# Patient Record
Sex: Female | Born: 1970
Health system: Southern US, Community
[De-identification: ages and names within clinical notes are randomized; demographics above are authoritative.]

## PROBLEM LIST (undated history)

## (undated) DIAGNOSIS — Q8500 Neurofibromatosis, unspecified: Secondary | ICD-10-CM

## (undated) DIAGNOSIS — Z86001 Personal history of in-situ neoplasm of cervix uteri: Secondary | ICD-10-CM

## (undated) DIAGNOSIS — G43909 Migraine, unspecified, not intractable, without status migrainosus: Secondary | ICD-10-CM

## (undated) HISTORY — DX: Personal history of in-situ neoplasm of cervix uteri: Z86.001

## (undated) HISTORY — DX: Migraine, unspecified, not intractable, without status migrainosus: G43.909

## (undated) HISTORY — DX: Neurofibromatosis, unspecified: Q85.00

---

## 1998-01-23 ENCOUNTER — Encounter: Admission: RE | Admit: 1998-01-23 | Discharge: 1998-01-23 | Payer: Self-pay | Admitting: Family Medicine

## 1998-02-22 ENCOUNTER — Other Ambulatory Visit: Admission: RE | Admit: 1998-02-22 | Discharge: 1998-02-22 | Payer: Self-pay | Admitting: *Deleted

## 1998-02-22 ENCOUNTER — Encounter: Admission: RE | Admit: 1998-02-22 | Discharge: 1998-02-22 | Payer: Self-pay | Admitting: Family Medicine

## 1998-03-01 ENCOUNTER — Encounter: Admission: RE | Admit: 1998-03-01 | Discharge: 1998-03-01 | Payer: Self-pay | Admitting: Family Medicine

## 1998-04-15 ENCOUNTER — Other Ambulatory Visit: Admission: RE | Admit: 1998-04-15 | Discharge: 1998-04-15 | Payer: Self-pay | Admitting: *Deleted

## 1998-04-15 ENCOUNTER — Encounter: Admission: RE | Admit: 1998-04-15 | Discharge: 1998-04-15 | Payer: Self-pay | Admitting: Family Medicine

## 1998-04-29 ENCOUNTER — Encounter: Admission: RE | Admit: 1998-04-29 | Discharge: 1998-04-29 | Payer: Self-pay | Admitting: Family Medicine

## 1998-05-13 ENCOUNTER — Encounter: Admission: RE | Admit: 1998-05-13 | Discharge: 1998-05-13 | Payer: Self-pay | Admitting: Family Medicine

## 1998-06-12 ENCOUNTER — Encounter: Admission: RE | Admit: 1998-06-12 | Discharge: 1998-06-12 | Payer: Self-pay | Admitting: Family Medicine

## 1998-06-19 ENCOUNTER — Encounter: Admission: RE | Admit: 1998-06-19 | Discharge: 1998-06-19 | Payer: Self-pay | Admitting: Sports Medicine

## 1998-06-23 ENCOUNTER — Encounter: Payer: Self-pay | Admitting: Emergency Medicine

## 1998-06-23 ENCOUNTER — Emergency Department (HOSPITAL_COMMUNITY): Admission: EM | Admit: 1998-06-23 | Discharge: 1998-06-23 | Payer: Self-pay | Admitting: Emergency Medicine

## 1998-07-26 ENCOUNTER — Encounter: Admission: RE | Admit: 1998-07-26 | Discharge: 1998-07-26 | Payer: Self-pay | Admitting: Family Medicine

## 1998-08-02 ENCOUNTER — Encounter: Admission: RE | Admit: 1998-08-02 | Discharge: 1998-08-02 | Payer: Self-pay | Admitting: Family Medicine

## 1998-08-06 ENCOUNTER — Encounter: Admission: RE | Admit: 1998-08-06 | Discharge: 1998-08-06 | Payer: Self-pay | Admitting: Sports Medicine

## 1998-08-13 ENCOUNTER — Other Ambulatory Visit: Admission: RE | Admit: 1998-08-13 | Discharge: 1998-08-13 | Payer: Self-pay | Admitting: Family Medicine

## 1998-08-13 ENCOUNTER — Encounter: Admission: RE | Admit: 1998-08-13 | Discharge: 1998-08-13 | Payer: Self-pay | Admitting: Family Medicine

## 1998-10-10 ENCOUNTER — Encounter: Admission: RE | Admit: 1998-10-10 | Discharge: 1998-10-10 | Payer: Self-pay | Admitting: Family Medicine

## 1998-10-14 ENCOUNTER — Encounter: Admission: RE | Admit: 1998-10-14 | Discharge: 1998-10-14 | Payer: Self-pay | Admitting: Sports Medicine

## 1998-11-01 ENCOUNTER — Encounter: Admission: RE | Admit: 1998-11-01 | Discharge: 1998-11-01 | Payer: Self-pay | Admitting: Family Medicine

## 1999-02-24 ENCOUNTER — Encounter: Admission: RE | Admit: 1999-02-24 | Discharge: 1999-02-24 | Payer: Self-pay | Admitting: Family Medicine

## 1999-04-29 ENCOUNTER — Encounter: Admission: RE | Admit: 1999-04-29 | Discharge: 1999-04-29 | Payer: Self-pay | Admitting: Family Medicine

## 1999-04-29 ENCOUNTER — Other Ambulatory Visit: Admission: RE | Admit: 1999-04-29 | Discharge: 1999-04-29 | Payer: Self-pay | Admitting: Family Medicine

## 1999-07-25 ENCOUNTER — Encounter: Admission: RE | Admit: 1999-07-25 | Discharge: 1999-07-25 | Payer: Self-pay | Admitting: Obstetrics & Gynecology

## 1999-07-25 ENCOUNTER — Other Ambulatory Visit: Admission: RE | Admit: 1999-07-25 | Discharge: 1999-07-25 | Payer: Self-pay | Admitting: Obstetrics & Gynecology

## 1999-08-08 ENCOUNTER — Encounter: Admission: RE | Admit: 1999-08-08 | Discharge: 1999-08-08 | Payer: Self-pay | Admitting: Obstetrics & Gynecology

## 1999-10-09 ENCOUNTER — Emergency Department (HOSPITAL_COMMUNITY): Admission: EM | Admit: 1999-10-09 | Discharge: 1999-10-09 | Payer: Self-pay | Admitting: *Deleted

## 2000-02-16 ENCOUNTER — Emergency Department (HOSPITAL_COMMUNITY): Admission: EM | Admit: 2000-02-16 | Discharge: 2000-02-16 | Payer: Self-pay | Admitting: *Deleted

## 2000-02-16 ENCOUNTER — Encounter: Payer: Self-pay | Admitting: Emergency Medicine

## 2000-02-16 ENCOUNTER — Encounter: Payer: Self-pay | Admitting: *Deleted

## 2000-02-18 ENCOUNTER — Emergency Department (HOSPITAL_COMMUNITY): Admission: EM | Admit: 2000-02-18 | Discharge: 2000-02-18 | Payer: Self-pay | Admitting: Emergency Medicine

## 2000-10-04 ENCOUNTER — Encounter: Admission: RE | Admit: 2000-10-04 | Discharge: 2000-10-04 | Payer: Self-pay | Admitting: Family Medicine

## 2000-10-07 ENCOUNTER — Encounter: Admission: RE | Admit: 2000-10-07 | Discharge: 2000-10-07 | Payer: Self-pay | Admitting: Family Medicine

## 2000-10-21 ENCOUNTER — Other Ambulatory Visit: Admission: RE | Admit: 2000-10-21 | Discharge: 2000-10-21 | Payer: Self-pay | Admitting: *Deleted

## 2000-10-21 ENCOUNTER — Encounter: Admission: RE | Admit: 2000-10-21 | Discharge: 2000-10-21 | Payer: Self-pay | Admitting: Family Medicine

## 2001-01-05 ENCOUNTER — Emergency Department (HOSPITAL_COMMUNITY): Admission: EM | Admit: 2001-01-05 | Discharge: 2001-01-05 | Payer: Self-pay | Admitting: Emergency Medicine

## 2001-04-06 ENCOUNTER — Encounter: Admission: RE | Admit: 2001-04-06 | Discharge: 2001-04-06 | Payer: Self-pay | Admitting: Family Medicine

## 2001-10-13 ENCOUNTER — Other Ambulatory Visit: Admission: RE | Admit: 2001-10-13 | Discharge: 2001-10-13 | Payer: Self-pay | Admitting: *Deleted

## 2001-10-13 ENCOUNTER — Encounter: Admission: RE | Admit: 2001-10-13 | Discharge: 2001-10-13 | Payer: Self-pay | Admitting: Family Medicine

## 2002-12-13 ENCOUNTER — Encounter: Admission: RE | Admit: 2002-12-13 | Discharge: 2002-12-13 | Payer: Self-pay | Admitting: Family Medicine

## 2002-12-13 ENCOUNTER — Other Ambulatory Visit: Admission: RE | Admit: 2002-12-13 | Discharge: 2002-12-13 | Payer: Self-pay | Admitting: Family Medicine

## 2003-03-14 ENCOUNTER — Encounter: Admission: RE | Admit: 2003-03-14 | Discharge: 2003-03-14 | Payer: Self-pay | Admitting: Sports Medicine

## 2003-06-15 ENCOUNTER — Encounter: Admission: RE | Admit: 2003-06-15 | Discharge: 2003-06-15 | Payer: Self-pay | Admitting: Sports Medicine

## 2003-09-05 ENCOUNTER — Encounter: Admission: RE | Admit: 2003-09-05 | Discharge: 2003-09-05 | Payer: Self-pay | Admitting: Family Medicine

## 2003-12-05 ENCOUNTER — Encounter: Admission: RE | Admit: 2003-12-05 | Discharge: 2003-12-05 | Payer: Self-pay | Admitting: Family Medicine

## 2004-01-11 ENCOUNTER — Encounter: Admission: RE | Admit: 2004-01-11 | Discharge: 2004-01-11 | Payer: Self-pay | Admitting: Family Medicine

## 2004-03-19 ENCOUNTER — Ambulatory Visit: Payer: Self-pay | Admitting: Family Medicine

## 2004-03-26 ENCOUNTER — Ambulatory Visit: Payer: Self-pay | Admitting: Family Medicine

## 2004-08-12 ENCOUNTER — Ambulatory Visit: Payer: Self-pay | Admitting: Sports Medicine

## 2004-12-27 ENCOUNTER — Encounter (INDEPENDENT_AMBULATORY_CARE_PROVIDER_SITE_OTHER): Payer: Self-pay | Admitting: *Deleted

## 2004-12-27 LAB — CONVERTED CEMR LAB

## 2005-01-06 ENCOUNTER — Ambulatory Visit: Payer: Self-pay | Admitting: Family Medicine

## 2005-01-06 ENCOUNTER — Encounter (INDEPENDENT_AMBULATORY_CARE_PROVIDER_SITE_OTHER): Payer: Self-pay | Admitting: Specialist

## 2005-04-08 ENCOUNTER — Ambulatory Visit: Payer: Self-pay | Admitting: Family Medicine

## 2005-07-01 ENCOUNTER — Ambulatory Visit: Payer: Self-pay | Admitting: Family Medicine

## 2005-09-30 ENCOUNTER — Ambulatory Visit: Payer: Self-pay | Admitting: Family Medicine

## 2006-07-02 ENCOUNTER — Encounter (INDEPENDENT_AMBULATORY_CARE_PROVIDER_SITE_OTHER): Payer: Self-pay | Admitting: Family Medicine

## 2006-07-02 ENCOUNTER — Ambulatory Visit: Payer: Self-pay | Admitting: Family Medicine

## 2006-07-02 LAB — CONVERTED CEMR LAB
Chlamydia, DNA Probe: NEGATIVE
GC Probe Amp, Genital: NEGATIVE

## 2006-07-27 ENCOUNTER — Ambulatory Visit: Payer: Self-pay | Admitting: Family Medicine

## 2006-07-27 ENCOUNTER — Encounter (INDEPENDENT_AMBULATORY_CARE_PROVIDER_SITE_OTHER): Payer: Self-pay | Admitting: Family Medicine

## 2006-08-10 ENCOUNTER — Ambulatory Visit: Payer: Self-pay | Admitting: Family Medicine

## 2006-08-26 DIAGNOSIS — D573 Sickle-cell trait: Secondary | ICD-10-CM | POA: Insufficient documentation

## 2006-08-27 ENCOUNTER — Encounter (INDEPENDENT_AMBULATORY_CARE_PROVIDER_SITE_OTHER): Payer: Self-pay | Admitting: *Deleted

## 2006-11-10 ENCOUNTER — Ambulatory Visit: Payer: Self-pay | Admitting: Family Medicine

## 2007-02-09 ENCOUNTER — Ambulatory Visit: Payer: Self-pay | Admitting: Family Medicine

## 2007-05-25 ENCOUNTER — Ambulatory Visit: Payer: Self-pay | Admitting: Family Medicine

## 2009-11-22 ENCOUNTER — Other Ambulatory Visit: Admission: RE | Admit: 2009-11-22 | Discharge: 2009-11-22 | Payer: Self-pay | Admitting: Family Medicine

## 2009-11-22 ENCOUNTER — Encounter: Payer: Self-pay | Admitting: Family Medicine

## 2009-11-22 ENCOUNTER — Ambulatory Visit: Payer: Self-pay | Admitting: Family Medicine

## 2009-11-22 DIAGNOSIS — G43909 Migraine, unspecified, not intractable, without status migrainosus: Secondary | ICD-10-CM

## 2009-11-22 DIAGNOSIS — R3589 Other polyuria: Secondary | ICD-10-CM | POA: Insufficient documentation

## 2009-11-22 DIAGNOSIS — M545 Low back pain, unspecified: Secondary | ICD-10-CM | POA: Insufficient documentation

## 2009-11-22 DIAGNOSIS — Q8500 Neurofibromatosis, unspecified: Secondary | ICD-10-CM | POA: Insufficient documentation

## 2009-11-22 DIAGNOSIS — R358 Other polyuria: Secondary | ICD-10-CM

## 2009-11-22 DIAGNOSIS — D069 Carcinoma in situ of cervix, unspecified: Secondary | ICD-10-CM | POA: Insufficient documentation

## 2009-11-22 HISTORY — DX: Neurofibromatosis, unspecified: Q85.00

## 2009-11-22 HISTORY — DX: Migraine, unspecified, not intractable, without status migrainosus: G43.909

## 2009-11-22 LAB — CONVERTED CEMR LAB
ALT: 8 units/L (ref 0–35)
CO2: 22 meq/L (ref 19–32)
Chlamydia, DNA Probe: NEGATIVE
Cholesterol: 191 mg/dL (ref 0–200)
GC Probe Amp, Genital: NEGATIVE
HCV Ab: NEGATIVE
Hepatitis B Surface Ag: NEGATIVE
LDL Cholesterol: 115 mg/dL — ABNORMAL HIGH (ref 0–99)
MCV: 83.3 fL (ref 78.0–100.0)
Nitrite: NEGATIVE
Platelets: 181 10*3/uL (ref 150–400)
Sodium: 140 meq/L (ref 135–145)
Total Bilirubin: 0.9 mg/dL (ref 0.3–1.2)
Total Protein: 7.2 g/dL (ref 6.0–8.3)
Urobilinogen, UA: 0.2
VLDL: 7 mg/dL (ref 0–40)
WBC Urine, dipstick: NEGATIVE
WBC: 4.5 10*3/uL (ref 4.0–10.5)
Whiff Test: NEGATIVE

## 2009-11-29 ENCOUNTER — Encounter: Payer: Self-pay | Admitting: Family Medicine

## 2009-11-29 LAB — CONVERTED CEMR LAB: Pap Smear: NORMAL

## 2010-07-20 ENCOUNTER — Encounter: Payer: Self-pay | Admitting: Sports Medicine

## 2010-07-31 NOTE — Letter (Signed)
Summary: Lipid Letter  [facility]  [address]   [place], [place] [address]   Phone: [phone]  Fax: [phone]    11/29/2009  [name] [address] [place], [place]  [address]  Dear Julia Dalton:  We have carefully reviewed your last lipid profile from 11/22/2009 and the results are noted below with a summary of recommendations for lipid management.    Cholesterol:       191     Goal: < 200   HDL "good" Cholesterol:   69     Goal: > 40   LDL "bad" Cholesterol:   115     Goal: < 160   Triglycerides:       37     Goal: < 150    Your numbers are excellent!  We also checked you kidney function, liver function and blood counts all of which were excellent.  Your STD tests were all negative.  Your pap test was normal.  Your next pap is due in 1 year.   If you have any questions, please call. We appreciate being able to work with you.   Sincerely,    [facility] Delbert Harness MD  Appended Document: Lipid Letter mailed letter to pt

## 2010-07-31 NOTE — Assessment & Plan Note (Signed)
Summary: cpe/pap,tcb   Vital Signs:  Patient profile:   40 year old female LMP:     [id number] Height:      65 inches Weight:      144.9 pounds BMI:     24.20 Temp:     97.9 degrees F oral Pulse rate:   83 / minute Pulse rhythm:   regular BP sitting:   99 / 75  (right arm) Cuff size:   regular  Vitals Entered By: Loralee Pacas CMA (Nov 22, 2009 9:25 AM) CC: cpe/pap Is Patient Diabetic? No Comments pt has been having lower back pain x 1 week LMP (date): 95621308     Enter LMP: [id number] Last PAP Result Done.   CC:  cpe/pap.  History of Present Illness: overall doing well and here for pap/cpe.  she has noticed in recent week some worsening migraine headaches, some lower back pain flares, urinary frequency and polydipsia.  she denies fevers, chills, eye pain or vision changes, runny nose, sore throat, chrest pain, sob, abd pain, urinay incontinence, dysuria, diarrhea, constipation, joint pain other than above, skin rashes that are new other than her chronic neurofibromatosis changes  Habits & Providers  Alcohol-Tobacco-Diet     Alcohol drinks/day: 0     Tobacco Status: never  Exercise-Depression-Behavior     Drug Use: never  Current Medications (verified): 1)  None  Allergies (verified): No Known Drug Allergies  Past History:  Past Medical History: h/o abnl pap, now WNL, (h/o CIN2)  neurofibromatosis sickle cell trait [id number] - all NSVD, all boys,  several therapeutic abortions  Past Surgical History: colpo - CIN I to III, s/p LEEP - 03/30/1999, colposcopy  - mild dysplasia,  abortions  Family History: Aunt with DM,  Father died in 2022/08/27 of liver CA, h/o heavy ETOH and tobacco use Mother - recently passed unexpectedly `chest problem` children, mother-neurofibromatosis cancer in dad's side of family (unknown type) brother - diabetes  Social History: Lives with 2 sons, Jeri Modena (b. 74), Shaqan (b. 1995).  No tobacco, ETOH, drugs.  Works as Immunologist.   1 sexual partner typically uses protection, unsure if he is monogamous.Smoking Status:  never Drug Use:  never  Review of Systems       see hpi  Physical Exam  General:  Well-developed,well-nourished,in no acute distress; alert,appropriate and cooperative throughout examination VSnoted and WNL Head:  Normocephalic and atraumatic without obvious abnormalities. Eyes:  No corneal or conjunctival inflammation noted. EOMI. Perrla. Vision grossly normal.  mild proptosis and ? of exopthalmos Ears:  External ear exam shows no significant lesions or deformities.  Otoscopic examination reveals clear canals, tympanic membranes are intact bilaterally without bulging, retraction, inflammation or discharge. Hearing is grossly normal bilaterally. Nose:  External nasal examination shows no deformity or inflammation. Nasal mucosa are pink and moist without lesions or exudates. Mouth:  Oral mucosa and oropharynx without lesions or exudates. fair dentition Neck:  No deformities, masses, or tenderness noted. Breasts:  No mass, nodules, thickening, tenderness, bulging, retraction, inflamation, nipple discharge noted.  has some neurofibromas on breasts as well as cafe-au-lait spots Lungs:  Normal respiratory effort, chest expands symmetrically. Lungs are clear to auscultation, no crackles or wheezes. Heart:  Normal rate and regular rhythm. S1 and S2 normal without gallop, murmur, click, rub or other extra sounds. Abdomen:  Bowel sounds positive,abdomen soft and non-tender without masses, organomegaly or hernias noted. Genitalia:  Normal introitus for age, no external lesions, no vaginal discharge, mucosa pink and moist, no vaginal or  cervical lesions, no vaginal atrophy, no friaility or hemorrhage, normal uterus size and position, no adnexal masses or tenderness.   cervical os stenotic Msk:  No deformity or scoliosis noted of thoracic or lumbar spine.   Pulses:  2+  in all extremities Extremities:  no cyanosis  clubbing or edema Neurologic:  alert & oriented X3, cranial nerves II-XII intact, and gait normal.   Skin:  multiple neurofibromas diffusley across body as well as cafe-au-lait spots.  no obviously suspecious lesions noted Axillary Nodes:  No palpable lymphadenopathy Psych:  Oriented X3, normally interactive, and good eye contact.     Impression & Recommendations:  Problem # 1:  ROUTINE GYNECOLOGICAL EXAMINATION (ICD-V72.31) Assessment Unchanged  overall doing well.  given handout to schedule a mammogram. pap performed today. get some basic blood work today for screening particularly given some of her review of symptoms (polyuria - check CMET, low back pain - check CBC, proptosis - check TSH, age - check lipids) get Tdap today since due.   given handout on birth control options.  call if anything abnormal on labs/studies? [phone]  Orders: [facility] - Est  40-64 yrs [phone])  Other Orders: Pap Smear-[facility] ([id number]) Urinalysis-[facility] (00000) Urine Culture-[facility] ([id number]) GC/Chlamydia-[facility] ([id number]) Hep C Ab-[facility] ([id number]) Hep Bs Ag-[facility] ([id number]) RPR-[facility] [phone]) Comp Met-[facility] [phone]) CBC-[facility] (51884) TSH-[facility] [phone]) Lipid-[facility] [phone]) [facility] ([address]25) Tdap => 56yrs IM (42706) Admin 1st Vaccine (23762)  Patient Instructions: 1)  Please schedule a mammogram 2)  I will call you if there is anything abnormal on your blood work. 3)  Look through your birth control options and let [place] know if there are some you have questions about.   4)  It was nice to meet you.  Laboratory Results   Urine Tests  Date/Time Received: Nov 22, 2009 9:57 AM  Date/Time Reported: Nov 22, 2009 10:30 AM   Routine Urinalysis   Color: yellow Appearance: Clear Glucose: negative   (Normal Range: Negative) Bilirubin: negative   (Normal Range: Negative) Ketone: negative   (Normal Range: Negative) Spec. Gravity: 1.010   (Normal Range: 1.003-1.035) Blood: small   (Normal  Range: Negative) pH: 5.5   (Normal Range: 5.0-8.0) Protein: negative   (Normal Range: Negative) Urobilinogen: 0.2   (Normal Range: 0-1) Nitrite: negative   (Normal Range: Negative) Leukocyte Esterace: negative   (Normal Range: Negative)  Urine Microscopic WBC/HPF: 0-3 RBC/HPF: 0-2 Bacteria/HPF: 1-5 Epithelial/HPF: trace    Comments: urine sent for culture ...........test performed by...........Marland KitchenTerese Door, CMA   Date/Time Received: Nov 22, 2009 10:49 AM  Date/Time Reported: Nov 22, 2009 10:52 AM   [employer] Source: vaginal WBC/hpf: 1-5 Bacteria/hpf: 3+  Rods Clue cells/hpf: none  Negative whiff Yeast/hpf: none Trichomonas/hpf: none Comments: 1-5 RBC's present...........test performed by...........Marland KitchenTerese Door, CMA      Prevention & Chronic Care Immunizations   Influenza vaccine: Not documented    Tetanus booster: 11/22/2009: Tdap    Pneumococcal vaccine: Not documented  Other Screening   Pap smear: Done.  (12/27/2004)    Mammogram: Not documented   Smoking status: never  (11/22/2009)  Lipids   Total Cholesterol: Not documented   LDL: Not documented   LDL Direct: Not documented   HDL: Not documented   Triglycerides: Not documented    Immunizations Administered:  Tetanus Vaccine:    Vaccine Type: Tdap    Site: right deltoid    Mfr: Sanofi Pasteur    Dose: 0.5 ml    Route: IM    Given by: Archie Patten  Barkley CMA    Exp. Date: 09/21/2011    Lot #: [id number]    VIS given: 05/17/07 version given Nov 22, 2009.

## 2014-01-10 ENCOUNTER — Emergency Department (HOSPITAL_COMMUNITY)
Admission: EM | Admit: 2014-01-10 | Discharge: 2014-01-10 | Disposition: A | Discharge status: Home / Self Care | Payer: Self-pay | Source: Home / Self Care | Attending: Family Medicine | Admitting: Family Medicine

## 2014-01-10 ENCOUNTER — Encounter (HOSPITAL_COMMUNITY): Payer: Self-pay | Admitting: Emergency Medicine

## 2014-01-10 DIAGNOSIS — N390 Urinary tract infection, site not specified: Secondary | ICD-10-CM

## 2014-01-10 LAB — POCT URINALYSIS DIP (DEVICE)
Bilirubin Urine: NEGATIVE
Bilirubin Urine: NEGATIVE
GLUCOSE, UA: NEGATIVE mg/dL
Glucose, UA: NEGATIVE mg/dL
KETONES UR: NEGATIVE mg/dL
Ketones, ur: NEGATIVE mg/dL
NITRITE: NEGATIVE
Nitrite: NEGATIVE
PH: 6 (ref 5.0–8.0)
Protein, ur: NEGATIVE mg/dL
Protein, ur: NEGATIVE mg/dL
SPECIFIC GRAVITY, URINE: 1.01 (ref 1.005–1.030)
Specific Gravity, Urine: 1.015 (ref 1.005–1.030)
Urobilinogen, UA: 0.2 mg/dL (ref 0.0–1.0)
Urobilinogen, UA: 0.2 mg/dL (ref 0.0–1.0)
pH: 5.5 (ref 5.0–8.0)

## 2014-01-10 MED ORDER — NITROFURANTOIN MONOHYD MACRO 100 MG PO CAPS: 100.0000 mg | ORAL_CAPSULE | Freq: Two times a day (BID) | ORAL | Status: DC

## 2014-01-10 NOTE — Discharge Instructions (Signed)

## 2014-01-10 NOTE — ED Notes (Signed)
Feels like another UTI; denies STD concerns at present

## 2014-01-10 NOTE — ED Provider Notes (Signed)
CSN: [id number]     Arrival date & time 01/10/14  0818 History   First MD Initiated Contact with Patient 01/10/14 [phone]     Chief Complaint  Patient presents with  . Urinary Tract Infection   (Consider location/radiation/quality/duration/timing/severity/associated sxs/prior Treatment) HPI Comments: 2 days of malodorous urine, urinary frequency, and mild suprapubic abdominal tenderness.  LNMP: 1 weeks ago PCP: [facility] No vaginal discharge No fever, N/V or flank pain  Patient is a 43 y.o. female presenting with urinary tract infection. The history is provided by the patient.  Urinary Tract Infection This is a new problem. The current episode started 2 days ago. The problem occurs constantly. The problem has been gradually worsening.    History reviewed. No pertinent past medical history. History reviewed. No pertinent past surgical history. History reviewed. No pertinent family history. History  Substance Use Topics  . Smoking status: Never Smoker   . Smokeless tobacco: Not on file  . Alcohol Use: Yes   OB History   Grav Para Term Preterm Abortions TAB SAB Ect Mult Living                 Review of Systems  All other systems reviewed and are negative.   Allergies  Review of patient's allergies indicates no known allergies.  Home Medications   Prior to Admission medications   Medication Sig Start Date End Date Taking? Authorizing Provider  nitrofurantoin, macrocrystal-monohydrate, (MACROBID) 100 MG capsule Take 1 capsule (100 mg total) by mouth 2 (two) times daily. X 7 days 01/10/14   Jess BartersJennifer Lee Sierrah Luevano, PA   BP 124/85  Pulse 101  Temp(Src) 98.3 F (36.8 C) (Oral)  Resp 18  SpO2 99% Physical Exam  Nursing note and vitals reviewed. Constitutional: She is oriented to person, place, and time. She appears well-developed and well-nourished. No distress.  HENT:  Head: Normocephalic and atraumatic.  Eyes: Conjunctivae are normal. No scleral icterus.  Cardiovascular: Normal  rate, regular rhythm and normal heart sounds.   Pulmonary/Chest: Effort normal and breath sounds normal. No respiratory distress. She has no wheezes.  Abdominal: Soft. Bowel sounds are normal. She exhibits no distension. There is no tenderness. There is no CVA tenderness.  Musculoskeletal: Normal range of motion.  Neurological: She is alert and oriented to person, place, and time.  Skin: Skin is warm and dry. No rash noted. No erythema.  Psychiatric: She has a normal mood and affect. Her behavior is normal.    ED Course  Procedures (including critical care time) Labs Review Labs Reviewed  POCT URINALYSIS DIP (DEVICE) - Abnormal; Notable for the following:    Hgb urine dipstick MODERATE (*)    Leukocytes, UA MODERATE (*)    All other components within normal limits  URINE CULTURE    Imaging Review No results found.   MDM   1. UTI (lower urinary tract infection)    Urine sent for C&S. UA suggestive of UTI. Will treat with 7 day course of Macrobid and advise PCP follow up if no improvement.   Jess BartersJennifer Lee [place] EneasPresson, [place] 01/10/14 [phone]

## 2014-01-11 NOTE — ED Provider Notes (Signed)
Medical screening examination/treatment/procedure(s) were performed by a resident physician or non-physician practitioner and as the supervising physician I was immediately available for consultation/collaboration.  Evan Corey, MD    Evan S Corey, MD 01/11/14 0803 

## 2014-01-12 LAB — URINE CULTURE: Colony Count: >=100000

## 2014-01-13 ENCOUNTER — Telehealth (HOSPITAL_COMMUNITY): Payer: Self-pay | Admitting: *Deleted

## 2014-01-13 NOTE — ED Notes (Signed)
Urine culture: Enterobacter aerogenes.  Treated with Macrobid- intermediate on sensitivity.  Lab shown to Dr. Denyse Amassorey.  He said Macrobid concentrates in the urine. Call for clinical improvement.  If not better will change to Cipro. I called pt. Pt. verified x 2 and given result.  Pt. said she is doing real good.  No further symptoms. Pt. instructed to finish all of the medication and get rechecked if any further symptoms.  Dr. Denyse Amassorey notified pt. is better. Vassie MoselleYork, Meko Masterson M 01/13/2014

## 2014-10-17 ENCOUNTER — Ambulatory Visit (INDEPENDENT_AMBULATORY_CARE_PROVIDER_SITE_OTHER): Payer: 59 | Admitting: Obstetrics and Gynecology

## 2014-10-17 ENCOUNTER — Encounter: Payer: Self-pay | Admitting: Obstetrics and Gynecology

## 2014-10-17 VITALS — BP 121/77 | HR 79 | Temp 98.0°F | Ht 65.0 in | Wt 155.0 lb

## 2014-10-17 DIAGNOSIS — Z7189 Other specified counseling: Secondary | ICD-10-CM | POA: Diagnosis not present

## 2014-10-17 DIAGNOSIS — Z0000 Encounter for general adult medical examination without abnormal findings: Secondary | ICD-10-CM | POA: Diagnosis not present

## 2014-10-17 DIAGNOSIS — Z7689 Persons encountering health services in other specified circumstances: Secondary | ICD-10-CM

## 2014-10-17 DIAGNOSIS — H539 Unspecified visual disturbance: Secondary | ICD-10-CM

## 2014-10-17 NOTE — Progress Notes (Signed)
     Subjective: Chief Complaint  Patient presents with  . Establish Care  . Headache  . Eye Problem    HPI: Julia Dalton is a 44 y.o. presenting to clinic today to establish care. She was previously seen in this clinic about 4 years ago. She is unsure why she stopped coming to the doctor. However she wants to reestablish care at this time. PMH is significant for neurofibromatosis, carcinoma in situ of cervix, and sickle cell trait. She will like to discuss the following:  #Headaches: -She has history of migraine headaches -States that she has had a headache everyday for the last couple weeks -Headaches occur in the mornings when she wakes up -takes 2 Aleve which help resolve headache -Severity scale 8 out of 10 -Denies any nausea vomiting photophobia or phonophobia -Does have some vision changes  #Vision Changes: -Patient has eye appointment scheduled for next week - states that she needs referral for insurance purposes -Complaining of recent vision changes some blurred vision -Please she needs new glasses  Health Maintenance: Supposed to have yearly Pap smears but has not had a Pap smear in over 5 years. Patient also due for lab work. Will also start mammography testing and 44 year old.   ROS reviewed and were negative unless otherwise noted in HPI. Pertinently, no chest pain, palpitations, SOB, Fever, Chills, Abd pain, N/V/D.  Past Medical, Surgical, Social, and Family History Reviewed & Updated per EMR.  Objective: BP 121/77 mmHg  Pulse 79  Temp(Src) 98 F (36.7 C) (Oral)  Ht 5\' 5"  (1.651 m)  Wt 155 lb (70.308 kg)  BMI 25.79 kg/m2  LMP 09/16/2014 (Approximate)  Physical Exam  Constitutional: She is oriented to person, place, and time and well-developed, well-nourished, and in no distress.  HENT:  Head: Normocephalic and atraumatic.  Mouth/Throat: Oropharynx is clear and moist.  Poor dentation  Eyes: Conjunctivae and EOM are normal. Pupils are equal, round, and  reactive to light.  Neck: Normal range of motion. Neck supple. No thyromegaly present.  Cardiovascular: Normal rate, regular rhythm, normal heart sounds and intact distal pulses.   Pulmonary/Chest: Effort normal and breath sounds normal.  Abdominal: Soft. Bowel sounds are normal. There is no tenderness.  Large neurofibroma located on R. side of abdomen measuring about 1"x1"x1"  Musculoskeletal: Normal range of motion. She exhibits no edema.  Lymphadenopathy:    She has no cervical adenopathy.  Neurological: She is alert and oriented to person, place, and time. No cranial nerve deficit. Gait normal.  Skin: Skin is warm and dry.  multiple neurofibromas.    Assessment/Plan: Please see problem based Assessment and Plan   Orders Placed This Encounter  Procedures  . Ambulatory referral to Ophthalmology    Referral Priority:  Routine    Referral Type:  Consultation    Referral Reason:  Specialty Services Required    Requested Specialty:  Ophthalmology    Number of Visits Requested:  1    Caryl AdaJazma Phelps, DO 10/17/2014, 3:04 PM PGY-1, [facility]

## 2014-10-17 NOTE — Assessment & Plan Note (Signed)
A: History of migraine headaches. Unsure if current headaches due to migraines. Vitals signs are stable. No red flag symptoms.  P: Patient to continue using Aleve for headache relief. Will monitor and follow-up at next visit.

## 2014-10-17 NOTE — Assessment & Plan Note (Signed)
Will get Pap smear and blood work at next visit in 1 week.

## 2014-10-17 NOTE — Patient Instructions (Signed)
Ms. Cliffton AstersWhite it was great to meet you today!  I am pleased to hear that things are going well for you.  Here are some of the things we discussed today: -I want you to come into clinic for a physical exam. I need to do a pap and get blood work on you. -At that next visit we can also discuss some of your other issues -Continue to use aleve at this time for headache. I believe it is do to your vision changes and need for updated exam. Please keep eye doctor exam -Referral has been placed  Please schedule a follow-up appointment for one week.   Thanks for allowing me to be a part of your care! Dr. Doroteo GlassmanPhelps

## 2014-10-25 ENCOUNTER — Encounter: Payer: Self-pay | Admitting: Obstetrics and Gynecology

## 2014-10-25 ENCOUNTER — Ambulatory Visit (INDEPENDENT_AMBULATORY_CARE_PROVIDER_SITE_OTHER): Payer: 59 | Admitting: Obstetrics and Gynecology

## 2014-10-25 ENCOUNTER — Other Ambulatory Visit (HOSPITAL_COMMUNITY)
Admission: RE | Admit: 2014-10-25 | Discharge: 2014-10-25 | Disposition: A | Discharge status: Home / Self Care | Payer: 59 | Source: Ambulatory Visit | Attending: Family Medicine | Admitting: Family Medicine

## 2014-10-25 VITALS — BP 127/85 | HR 84 | Temp 98.3°F | Ht 65.0 in | Wt 156.0 lb

## 2014-10-25 DIAGNOSIS — Z0189 Encounter for other specified special examinations: Secondary | ICD-10-CM

## 2014-10-25 DIAGNOSIS — Z30011 Encounter for initial prescription of contraceptive pills: Secondary | ICD-10-CM

## 2014-10-25 DIAGNOSIS — Z1151 Encounter for screening for human papillomavirus (HPV): Secondary | ICD-10-CM | POA: Insufficient documentation

## 2014-10-25 DIAGNOSIS — Z202 Contact with and (suspected) exposure to infections with a predominantly sexual mode of transmission: Secondary | ICD-10-CM

## 2014-10-25 DIAGNOSIS — Q8500 Neurofibromatosis, unspecified: Secondary | ICD-10-CM

## 2014-10-25 DIAGNOSIS — Z01419 Encounter for gynecological examination (general) (routine) without abnormal findings: Secondary | ICD-10-CM

## 2014-10-25 DIAGNOSIS — Z124 Encounter for screening for malignant neoplasm of cervix: Secondary | ICD-10-CM

## 2014-10-25 DIAGNOSIS — Z0000 Encounter for general adult medical examination without abnormal findings: Secondary | ICD-10-CM

## 2014-10-25 DIAGNOSIS — IMO0001 Reserved for inherently not codable concepts without codable children: Secondary | ICD-10-CM | POA: Insufficient documentation

## 2014-10-25 DIAGNOSIS — Z113 Encounter for screening for infections with a predominantly sexual mode of transmission: Secondary | ICD-10-CM | POA: Insufficient documentation

## 2014-10-25 LAB — COMPREHENSIVE METABOLIC PANEL
ALBUMIN: 3.8 g/dL (ref 3.5–5.2)
ALK PHOS: 46 U/L (ref 39–117)
ALT: 11 U/L (ref 0–35)
AST: 15 U/L (ref 0–37)
BILIRUBIN TOTAL: 0.3 mg/dL (ref 0.2–1.2)
BUN: 10 mg/dL (ref 6–23)
CO2: 25 mEq/L (ref 19–32)
Calcium: 8.7 mg/dL (ref 8.4–10.5)
Chloride: 105 mEq/L (ref 96–112)
Creat: 0.68 mg/dL (ref 0.50–1.10)
Glucose, Bld: 84 mg/dL (ref 70–99)
Potassium: 4 mEq/L (ref 3.5–5.3)
SODIUM: 138 meq/L (ref 135–145)
Total Protein: 6.5 g/dL (ref 6.0–8.3)

## 2014-10-25 LAB — LIPID PANEL
CHOL/HDL RATIO: 2.7 ratio
Cholesterol: 158 mg/dL (ref 0–200)
HDL: 59 mg/dL (ref >=46)
LDL CALC: 88 mg/dL (ref 0–99)
TRIGLYCERIDES: 54 mg/dL (ref <150)
VLDL: 11 mg/dL (ref 0–40)

## 2014-10-25 LAB — CBC
HEMATOCRIT: 38.2 % (ref 36.0–46.0)
Hemoglobin: 12.7 g/dL (ref 12.0–15.0)
MCH: 28.1 pg (ref 26.0–34.0)
MCHC: 33.2 g/dL (ref 30.0–36.0)
MCV: 84.5 fL (ref 78.0–100.0)
MPV: 10.9 fL (ref 8.6–12.4)
PLATELETS: 182 10*3/uL (ref 150–400)
RBC: 4.52 MIL/uL (ref 3.87–5.11)
RDW: 14.6 % (ref 11.5–15.5)
WBC: 6.2 10*3/uL (ref 4.0–10.5)

## 2014-10-25 MED ORDER — NORGESTIMATE-ETH ESTRADIOL 0.25-35 MG-MCG PO TABS: 1.0000 | ORAL_TABLET | Freq: Every day | ORAL | Status: DC

## 2014-10-25 NOTE — Assessment & Plan Note (Addendum)
Unsure of which type and patient in unaware. There is a strong family history. 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 Without complication currently.

## 2014-10-25 NOTE — Progress Notes (Signed)
  Subjective:     Julia Dalton is a 44 y.o. female and is here for a comprehensive physical exam. The patient reports no problems.  Patient wants full STD screening. Also would like to know her options for birth control.   History   Social History  . Marital Status: Single    Spouse Name: N/A  . Number of Children: 2  . Years of Education: 14   Occupational History  . Not on file.   Social History Main Topics  . Smoking status: Never Smoker   . Smokeless tobacco: Not on file  . Alcohol Use: Yes     Comment: occasional  . Drug Use: No  . Sexual Activity: Yes    Birth Control/ Protection: Condom   Other Topics Concern  . Not on file   Social History Narrative   Health Maintenance  Topic Date Due  . HIV Screening  02/03/1984  . INFLUENZA VACCINE  01/28/2015  . PAP SMEAR  10/24/2017  . TETANUS/TDAP  11/23/2019    The following portions of the patient's history were reviewed and updated as appropriate: allergies, current medications, past family history, past medical history, past social history, past surgical history and problem list.  Review of Systems A comprehensive review of systems was negative.   Objective:    BP 127/85 mmHg  Pulse 84  Temp(Src) 98.3 F (36.8 C) (Oral)  Ht 5\' 5"  (1.651 m)  Wt 156 lb (70.761 kg)  BMI 25.96 kg/m2  LMP 10/16/2014 General appearance: alert, cooperative and no distress Head: Normocephalic, without obvious abnormality, atraumatic Eyes: conjunctivae/corneas clear. PERRL, EOM's intact. Fundi benign. Throat: lips, mucosa, and tongue normal; teeth and gums normal Neck: no adenopathy, supple, symmetrical, trachea midline and thyroid not enlarged, symmetric, no tenderness/mass/nodules Lungs: clear to auscultation bilaterally Breasts: normal appearance, no masses or tenderness, Inspection negative, No nipple retraction or dimpling, No nipple discharge or bleeding, No axillary or supraclavicular adenopathy Heart: regular rate and  rhythm, S1, S2 normal, no murmur, click, rub or gallop Abdomen: soft, non-tender; bowel sounds normal; no masses,  no organomegaly Pelvic: cervix normal in appearance, external genitalia normal, no adnexal masses or tenderness, no cervical motion tenderness, uterus normal size, shape, and consistency and vagina normal without discharge Extremities: extremities normal, atraumatic, no cyanosis or edema Pulses: 2+ and symmetric Skin: normal or cafe-au-lait spot(s) - scattered, excoriation - hand(s) right and multiple scattered neurofibromas diffusely Neurologic: Grossly normal    Assessment:    Healthy female exam.      Plan:   Please see Problem based Assessment and Plan   See After Visit Summary for Counseling Recommendations   Orders Placed This Encounter  Procedures  . Lipid panel  . CBC  . Comprehensive metabolic panel  . HIV antibody  . RPR   Meds ordered this encounter  Medications  . norgestimate-ethinyl estradiol (SPRINTEC 28) 0.25-35 MG-MCG tablet    Sig: Take 1 tablet by mouth daily.    Dispense:  1 Package    Refill:  [id number]   Kahmya Pinkham, DO 10/25/2014, 3:44 PM PGY-1, [facility]

## 2014-10-25 NOTE — Assessment & Plan Note (Signed)
Pap smear and routine blood work obtained for annual wellness visit.

## 2014-10-25 NOTE — Assessment & Plan Note (Signed)
Discussed contraception options with patient. Rx given for OCPs.

## 2014-10-25 NOTE — Patient Instructions (Signed)
Preventive Care for Adults A healthy lifestyle and preventive care can promote health and wellness. Preventive health guidelines for women include the following key practices.  A routine yearly physical is a good way to check with your health care provider about your health and preventive screening. It is a chance to share any concerns and updates on your health and to receive a thorough exam.  Visit your dentist for a routine exam and preventive care every 6 months. Brush your teeth twice a day and floss once a day. Good oral hygiene prevents tooth decay and gum disease.  The frequency of eye exams is based on your age, health, family medical history, use of contact lenses, and other factors. Follow your health care provider's recommendations for frequency of eye exams.  Eat a healthy diet. Foods like vegetables, fruits, whole grains, low-fat dairy products, and lean protein foods contain the nutrients you need without too many calories. Decrease your intake of foods high in solid fats, added sugars, and salt. Eat the right amount of calories for you.Get information about a proper diet from your health care provider, if necessary.  Regular physical exercise is one of the most important things you can do for your health. Most adults should get at least 150 minutes of moderate-intensity exercise (any activity that increases your heart rate and causes you to sweat) each week. In addition, most adults need muscle-strengthening exercises on 2 or more days a week.  Maintain a healthy weight. The body mass index (BMI) is a screening tool to identify possible weight problems. It provides an estimate of body fat based on height and weight. Your health care provider can find your BMI and can help you achieve or maintain a healthy weight.For adults 20 years and older:  A BMI below 18.5 is considered underweight.  A BMI of 18.5 to 24.9 is normal.  A BMI of 25 to 29.9 is considered overweight.  A BMI of  30 and above is considered obese.  Maintain normal blood lipids and cholesterol levels by exercising and minimizing your intake of saturated fat. Eat a balanced diet with plenty of fruit and vegetables. Blood tests for lipids and cholesterol should begin at age 20 and be repeated every 5 years. If your lipid or cholesterol levels are high, you are over 50, or you are at high risk for heart disease, you may need your cholesterol levels checked more frequently.Ongoing high lipid and cholesterol levels should be treated with medicines if diet and exercise are not working.  If you smoke, find out from your health care provider how to quit. If you do not use tobacco, do not start.  Lung cancer screening is recommended for adults aged 55-80 years who are at high risk for developing lung cancer because of a history of smoking. A yearly low-dose CT scan of the lungs is recommended for people who have at least a 30-pack-year history of smoking and are a current smoker or have quit within the past 15 years. A pack year of smoking is smoking an average of 1 pack of cigarettes a day for 1 year (for example: 1 pack a day for 30 years or 2 packs a day for 15 years). Yearly screening should continue until the smoker has stopped smoking for at least 15 years. Yearly screening should be stopped for people who develop a health problem that would prevent them from having lung cancer treatment.  If you are pregnant, do not drink alcohol. If you are breastfeeding,   be very cautious about drinking alcohol. If you are not pregnant and choose to drink alcohol, do not have more than 1 drink per day. One drink is considered to be 12 ounces (355 mL) of beer, 5 ounces (148 mL) of wine, or 1.5 ounces (44 mL) of liquor.  Avoid use of street drugs. Do not share needles with anyone. Ask for help if you need support or instructions about stopping the use of drugs.  High blood pressure causes heart disease and increases the risk of  stroke. Your blood pressure should be checked at least every 1 to 2 years. Ongoing high blood pressure should be treated with medicines if weight loss and exercise do not work.  If you are 3-86 years old, ask your health care provider if you should take aspirin to prevent strokes.  Diabetes screening involves taking a blood sample to check your fasting blood sugar level. This should be done once every 3 years, after age 67, if you are within normal weight and without risk factors for diabetes. Testing should be considered at a younger age or be carried out more frequently if you are overweight and have at least 1 risk factor for diabetes.  Breast cancer screening is essential preventive care for women. You should practice "breast self-awareness." This means understanding the normal appearance and feel of your breasts and may include breast self-examination. Any changes detected, no matter how small, should be reported to a health care provider. Women in their 8s and 30s should have a clinical breast exam (CBE) by a health care provider as part of a regular health exam every 1 to 3 years. After age 70, women should have a CBE every year. Starting at age 25, women should consider having a mammogram (breast X-ray test) every year. Women who have a family history of breast cancer should talk to their health care provider about genetic screening. Women at a high risk of breast cancer should talk to their health care providers about having an MRI and a mammogram every year.  Breast cancer gene (BRCA)-related cancer risk assessment is recommended for women who have family members with BRCA-related cancers. BRCA-related cancers include breast, ovarian, tubal, and peritoneal cancers. Having family members with these cancers may be associated with an increased risk for harmful changes (mutations) in the breast cancer genes BRCA1 and BRCA2. Results of the assessment will determine the need for genetic counseling and  BRCA1 and BRCA2 testing.  Routine pelvic exams to screen for cancer are no longer recommended for nonpregnant women who are considered low risk for cancer of the pelvic organs (ovaries, uterus, and vagina) and who do not have symptoms. Ask your health care provider if a screening pelvic exam is right for you.  If you have had past treatment for cervical cancer or a condition that could lead to cancer, you need Pap tests and screening for cancer for at least 20 years after your treatment. If Pap tests have been discontinued, your risk factors (such as having a new sexual partner) need to be reassessed to determine if screening should be resumed. Some women have medical problems that increase the chance of getting cervical cancer. In these cases, your health care provider may recommend more frequent screening and Pap tests.  The HPV test is an additional test that may be used for cervical cancer screening. The HPV test looks for the virus that can cause the cell changes on the cervix. The cells collected during the Pap test can be  tested for HPV. The HPV test could be used to screen women aged 30 years and older, and should be used in women of any age who have unclear Pap test results. After the age of 30, women should have HPV testing at the same frequency as a Pap test.  Colorectal cancer can be detected and often prevented. Most routine colorectal cancer screening begins at the age of 50 years and continues through age 75 years. However, your health care provider may recommend screening at an earlier age if you have risk factors for colon cancer. On a yearly basis, your health care provider may provide home test kits to check for hidden blood in the stool. Use of a small camera at the end of a tube, to directly examine the colon (sigmoidoscopy or colonoscopy), can detect the earliest forms of colorectal cancer. Talk to your health care provider about this at age 50, when routine screening begins. Direct  exam of the colon should be repeated every 5-10 years through age 75 years, unless early forms of pre-cancerous polyps or small growths are found.  People who are at an increased risk for hepatitis B should be screened for this virus. You are considered at high risk for hepatitis B if:  You were born in a country where hepatitis B occurs often. Talk with your health care provider about which countries are considered high risk.  Your parents were born in a high-risk country and you have not received a shot to protect against hepatitis B (hepatitis B vaccine).  You have HIV or AIDS.  You use needles to inject street drugs.  You live with, or have sex with, someone who has hepatitis B.  You get hemodialysis treatment.  You take certain medicines for conditions like cancer, organ transplantation, and autoimmune conditions.  Hepatitis C blood testing is recommended for all people born from 1945 through 1965 and any individual with known risks for hepatitis C.  Practice safe sex. Use condoms and avoid high-risk sexual practices to reduce the spread of sexually transmitted infections (STIs). STIs include gonorrhea, chlamydia, syphilis, trichomonas, herpes, HPV, and human immunodeficiency virus (HIV). Herpes, HIV, and HPV are viral illnesses that have no cure. They can result in disability, cancer, and death.  You should be screened for sexually transmitted illnesses (STIs) including gonorrhea and chlamydia if:  You are sexually active and are younger than 24 years.  You are older than 24 years and your health care provider tells you that you are at risk for this type of infection.  Your sexual activity has changed since you were last screened and you are at an increased risk for chlamydia or gonorrhea. Ask your health care provider if you are at risk.  If you are at risk of being infected with HIV, it is recommended that you take a prescription medicine daily to prevent HIV infection. This is  called preexposure prophylaxis (PrEP). You are considered at risk if:  You are a heterosexual woman, are sexually active, and are at increased risk for HIV infection.  You take drugs by injection.  You are sexually active with a partner who has HIV.  Talk with your health care provider about whether you are at high risk of being infected with HIV. If you choose to begin PrEP, you should first be tested for HIV. You should then be tested every 3 months for as long as you are taking PrEP.  Osteoporosis is a disease in which the bones lose minerals and strength   with aging. This can result in serious bone fractures or breaks. The risk of osteoporosis can be identified using a bone density scan. Women ages 65 years and over and women at risk for fractures or osteoporosis should discuss screening with their health care providers. Ask your health care provider whether you should take a calcium supplement or vitamin D to reduce the rate of osteoporosis.  Menopause can be associated with physical symptoms and risks. Hormone replacement therapy is available to decrease symptoms and risks. You should talk to your health care provider about whether hormone replacement therapy is right for you.  Use sunscreen. Apply sunscreen liberally and repeatedly throughout the day. You should seek shade when your shadow is shorter than you. Protect yourself by wearing long sleeves, pants, a wide-brimmed hat, and sunglasses year round, whenever you are outdoors.  Once a month, do a whole body skin exam, using a mirror to look at the skin on your back. Tell your health care provider of new moles, moles that have irregular borders, moles that are larger than a pencil eraser, or moles that have changed in shape or color.  Stay current with required vaccines (immunizations).  Influenza vaccine. All adults should be immunized every year.  Tetanus, diphtheria, and acellular pertussis (Td, Tdap) vaccine. Pregnant women should  receive 1 dose of Tdap vaccine during each pregnancy. The dose should be obtained regardless of the length of time since the last dose. Immunization is preferred during the 27th-36th week of gestation. An adult who has not previously received Tdap or who does not know her vaccine status should receive 1 dose of Tdap. This initial dose should be followed by tetanus and diphtheria toxoids (Td) booster doses every 10 years. Adults with an unknown or incomplete history of completing a 3-dose immunization series with Td-containing vaccines should begin or complete a primary immunization series including a Tdap dose. Adults should receive a Td booster every 10 years.  Varicella vaccine. An adult without evidence of immunity to varicella should receive 2 doses or a second dose if she has previously received 1 dose. Pregnant females who do not have evidence of immunity should receive the first dose after pregnancy. This first dose should be obtained before leaving the health care facility. The second dose should be obtained 4-8 weeks after the first dose.  Human papillomavirus (HPV) vaccine. Females aged 13-26 years who have not received the vaccine previously should obtain the 3-dose series. The vaccine is not recommended for use in pregnant females. However, pregnancy testing is not needed before receiving a dose. If a female is found to be pregnant after receiving a dose, no treatment is needed. In that case, the remaining doses should be delayed until after the pregnancy. Immunization is recommended for any person with an immunocompromised condition through the age of 26 years if she did not get any or all doses earlier. During the 3-dose series, the second dose should be obtained 4-8 weeks after the first dose. The third dose should be obtained 24 weeks after the first dose and 16 weeks after the second dose.  Zoster vaccine. One dose is recommended for adults aged 60 years or older unless certain conditions are  present.  Measles, mumps, and rubella (MMR) vaccine. Adults born before 1957 generally are considered immune to measles and mumps. Adults born in 1957 or later should have 1 or more doses of MMR vaccine unless there is a contraindication to the vaccine or there is laboratory evidence of immunity to   each of the three diseases. A routine second dose of MMR vaccine should be obtained at least 28 days after the first dose for students attending postsecondary schools, health care workers, or international travelers. People who received inactivated measles vaccine or an unknown type of measles vaccine during 1963-1967 should receive 2 doses of MMR vaccine. People who received inactivated mumps vaccine or an unknown type of mumps vaccine before 1979 and are at high risk for mumps infection should consider immunization with 2 doses of MMR vaccine. For females of childbearing age, rubella immunity should be determined. If there is no evidence of immunity, females who are not pregnant should be vaccinated. If there is no evidence of immunity, females who are pregnant should delay immunization until after pregnancy. Unvaccinated health care workers born before 1957 who lack laboratory evidence of measles, mumps, or rubella immunity or laboratory confirmation of disease should consider measles and mumps immunization with 2 doses of MMR vaccine or rubella immunization with 1 dose of MMR vaccine.  Pneumococcal 13-valent conjugate (PCV13) vaccine. When indicated, a person who is uncertain of her immunization history and has no record of immunization should receive the PCV13 vaccine. An adult aged 19 years or older who has certain medical conditions and has not been previously immunized should receive 1 dose of PCV13 vaccine. This PCV13 should be followed with a dose of pneumococcal polysaccharide (PPSV23) vaccine. The PPSV23 vaccine dose should be obtained at least 8 weeks after the dose of PCV13 vaccine. An adult aged 19  years or older who has certain medical conditions and previously received 1 or more doses of PPSV23 vaccine should receive 1 dose of PCV13. The PCV13 vaccine dose should be obtained 1 or more years after the last PPSV23 vaccine dose.  Pneumococcal polysaccharide (PPSV23) vaccine. When PCV13 is also indicated, PCV13 should be obtained first. All adults aged 65 years and older should be immunized. An adult younger than age 65 years who has certain medical conditions should be immunized. Any person who resides in a nursing home or long-term care facility should be immunized. An adult smoker should be immunized. People with an immunocompromised condition and certain other conditions should receive both PCV13 and PPSV23 vaccines. People with human immunodeficiency virus (HIV) infection should be immunized as soon as possible after diagnosis. Immunization during chemotherapy or radiation therapy should be avoided. Routine use of PPSV23 vaccine is not recommended for American Indians, [place] Natives, or people younger than 65 years unless there are medical conditions that require PPSV23 vaccine. When indicated, people who have unknown immunization and have no record of immunization should receive PPSV23 vaccine. One-time revaccination 5 years after the first dose of PPSV23 is recommended for people aged 19-64 years who have chronic kidney failure, nephrotic syndrome, asplenia, or immunocompromised conditions. People who received 1-2 doses of PPSV23 before age 65 years should receive another dose of PPSV23 vaccine at age 65 years or later if at least 5 years have passed since the previous dose. Doses of PPSV23 are not needed for people immunized with PPSV23 at or after age 65 years.  Meningococcal vaccine. Adults with asplenia or persistent complement component deficiencies should receive 2 doses of quadrivalent meningococcal conjugate (MenACWY-D) vaccine. The doses should be obtained at least 2 months apart.  Microbiologists working with certain meningococcal bacteria, military recruits, people at risk during an outbreak, and people who travel to or live in countries with a high rate of meningitis should be immunized. A first-year college student up through age   21 years who is living in a residence hall should receive a dose if she did not receive a dose on or after her 16th birthday. Adults who have certain high-risk conditions should receive one or more doses of vaccine.  Hepatitis A vaccine. Adults who wish to be protected from this disease, have certain high-risk conditions, work with hepatitis A-infected animals, work in hepatitis A research labs, or travel to or work in countries with a high rate of hepatitis A should be immunized. Adults who were previously unvaccinated and who anticipate close contact with an international adoptee during the first 60 days after arrival in the [place] from a country with a high rate of hepatitis A should be immunized.  Hepatitis B vaccine. Adults who wish to be protected from this disease, have certain high-risk conditions, may be exposed to blood or other infectious body fluids, are household contacts or sex partners of hepatitis B positive people, are clients or workers in certain care facilities, or travel to or work in countries with a high rate of hepatitis B should be immunized.  Haemophilus influenzae type b (Hib) vaccine. A previously unvaccinated person with asplenia or sickle cell disease or having a scheduled splenectomy should receive 1 dose of Hib vaccine. Regardless of previous immunization, a recipient of a hematopoietic stem cell transplant should receive a 3-dose series 6-12 months after her successful transplant. Hib vaccine is not recommended for adults with HIV infection. Preventive Services / Frequency Ages 64 to 68 years  Blood pressure check.** / Every 1 to 2 years.  Lipid and cholesterol check.** / Every 5 years beginning at age  22.  Clinical breast exam.** / Every 3 years for women in their 88s and 53s.  BRCA-related cancer risk assessment.** / For women who have family members with a BRCA-related cancer (breast, ovarian, tubal, or peritoneal cancers).  Pap test.** / Every 2 years from ages 90 through 51. Every 3 years starting at age 21 through age 56 or 3 with a history of 3 consecutive normal Pap tests.  HPV screening.** / Every 3 years from ages 24 through ages 1 to 46 with a history of 3 consecutive normal Pap tests.  Hepatitis C blood test.** / For any individual with known risks for hepatitis C.  Skin self-exam. / Monthly.  Influenza vaccine. / Every year.  Tetanus, diphtheria, and acellular pertussis (Tdap, Td) vaccine.** / Consult your health care provider. Pregnant women should receive 1 dose of Tdap vaccine during each pregnancy. 1 dose of Td every 10 years.  Varicella vaccine.** / Consult your health care provider. Pregnant females who do not have evidence of immunity should receive the first dose after pregnancy.  HPV vaccine. / 3 doses over 6 months, if 72 and younger. The vaccine is not recommended for use in pregnant females. However, pregnancy testing is not needed before receiving a dose.  Measles, mumps, rubella (MMR) vaccine.** / You need at least 1 dose of MMR if you were born in 1957 or later. You may also need a 2nd dose. For females of childbearing age, rubella immunity should be determined. If there is no evidence of immunity, females who are not pregnant should be vaccinated. If there is no evidence of immunity, females who are pregnant should delay immunization until after pregnancy.  Pneumococcal 13-valent conjugate (PCV13) vaccine.** / Consult your health care provider.  Pneumococcal polysaccharide (PPSV23) vaccine.** / 1 to 2 doses if you smoke cigarettes or if you have certain conditions.  Meningococcal vaccine.** /  1 dose if you are age 19 to 21 years and a first-year college  student living in a residence hall, or have one of several medical conditions, you need to get vaccinated against meningococcal disease. You may also need additional booster doses.  Hepatitis A vaccine.** / Consult your health care provider.  Hepatitis B vaccine.** / Consult your health care provider.  Haemophilus influenzae type b (Hib) vaccine.** / Consult your health care provider. Ages 40 to 64 years  Blood pressure check.** / Every 1 to 2 years.  Lipid and cholesterol check.** / Every 5 years beginning at age 20 years.  Lung cancer screening. / Every year if you are aged 55-80 years and have a 30-pack-year history of smoking and currently smoke or have quit within the past 15 years. Yearly screening is stopped once you have quit smoking for at least 15 years or develop a health problem that would prevent you from having lung cancer treatment.  Clinical breast exam.** / Every year after age 40 years.  BRCA-related cancer risk assessment.** / For women who have family members with a BRCA-related cancer (breast, ovarian, tubal, or peritoneal cancers).  Mammogram.** / Every year beginning at age 40 years and continuing for as long as you are in good health. Consult with your health care provider.  Pap test.** / Every 3 years starting at age 30 years through age 65 or 70 years with a history of 3 consecutive normal Pap tests.  HPV screening.** / Every 3 years from ages 30 years through ages 65 to 70 years with a history of 3 consecutive normal Pap tests.  Fecal occult blood test (FOBT) of stool. / Every year beginning at age 50 years and continuing until age 75 years. You may not need to do this test if you get a colonoscopy every 10 years.  Flexible sigmoidoscopy or colonoscopy.** / Every 5 years for a flexible sigmoidoscopy or every 10 years for a colonoscopy beginning at age 50 years and continuing until age 75 years.  Hepatitis C blood test.** / For all people born from 1945 through  1965 and any individual with known risks for hepatitis C.  Skin self-exam. / Monthly.  Influenza vaccine. / Every year.  Tetanus, diphtheria, and acellular pertussis (Tdap/Td) vaccine.** / Consult your health care provider. Pregnant women should receive 1 dose of Tdap vaccine during each pregnancy. 1 dose of Td every 10 years.  Varicella vaccine.** / Consult your health care provider. Pregnant females who do not have evidence of immunity should receive the first dose after pregnancy.  Zoster vaccine.** / 1 dose for adults aged 60 years or older.  Measles, mumps, rubella (MMR) vaccine.** / You need at least 1 dose of MMR if you were born in 1957 or later. You may also need a 2nd dose. For females of childbearing age, rubella immunity should be determined. If there is no evidence of immunity, females who are not pregnant should be vaccinated. If there is no evidence of immunity, females who are pregnant should delay immunization until after pregnancy.  Pneumococcal 13-valent conjugate (PCV13) vaccine.** / Consult your health care provider.  Pneumococcal polysaccharide (PPSV23) vaccine.** / 1 to 2 doses if you smoke cigarettes or if you have certain conditions.  Meningococcal vaccine.** / Consult your health care provider.  Hepatitis A vaccine.** / Consult your health care provider.  Hepatitis B vaccine.** / Consult your health care provider.  Haemophilus influenzae type b (Hib) vaccine.** / Consult your health care provider. Ages 65   years and over  Blood pressure check.** / Every 1 to 2 years.  Lipid and cholesterol check.** / Every 5 years beginning at age 22 years.  Lung cancer screening. / Every year if you are aged 73-80 years and have a 30-pack-year history of smoking and currently smoke or have quit within the past 15 years. Yearly screening is stopped once you have quit smoking for at least 15 years or develop a health problem that would prevent you from having lung cancer  treatment.  Clinical breast exam.** / Every year after age 4 years.  BRCA-related cancer risk assessment.** / For women who have family members with a BRCA-related cancer (breast, ovarian, tubal, or peritoneal cancers).  Mammogram.** / Every year beginning at age 40 years and continuing for as long as you are in good health. Consult with your health care provider.  Pap test.** / Every 3 years starting at age 9 years through age 34 or 91 years with 3 consecutive normal Pap tests. Testing can be stopped between 65 and 70 years with 3 consecutive normal Pap tests and no abnormal Pap or HPV tests in the past 10 years.  HPV screening.** / Every 3 years from ages 57 years through ages 64 or 45 years with a history of 3 consecutive normal Pap tests. Testing can be stopped between 65 and 70 years with 3 consecutive normal Pap tests and no abnormal Pap or HPV tests in the past 10 years.  Fecal occult blood test (FOBT) of stool. / Every year beginning at age 15 years and continuing until age 17 years. You may not need to do this test if you get a colonoscopy every 10 years.  Flexible sigmoidoscopy or colonoscopy.** / Every 5 years for a flexible sigmoidoscopy or every 10 years for a colonoscopy beginning at age 86 years and continuing until age 71 years.  Hepatitis C blood test.** / For all people born from [birth year] through 1967 and any individual with known risks for hepatitis C.  Osteoporosis screening.** / A one-time screening for women ages 83 years and over and women at risk for fractures or osteoporosis.  Skin self-exam. / Monthly.  Influenza vaccine. / Every year.  Tetanus, diphtheria, and acellular pertussis (Tdap/Td) vaccine.** / 1 dose of Td every 10 years.  Varicella vaccine.** / Consult your health care provider.  Zoster vaccine.** / 1 dose for adults aged 61 years or older.  Pneumococcal 13-valent conjugate (PCV13) vaccine.** / Consult your health care provider.  Pneumococcal  polysaccharide (PPSV23) vaccine.** / 1 dose for all adults aged 28 years and older.  Meningococcal vaccine.** / Consult your health care provider.  Hepatitis A vaccine.** / Consult your health care provider.  Hepatitis B vaccine.** / Consult your health care provider.  Haemophilus influenzae type b (Hib) vaccine.** / Consult your health care provider. ** Family history and personal history of risk and conditions may change your health care provider's recommendations. Document Released: 08/11/2001 Document Revised: 10/30/2013 Document Reviewed: 11/10/2010 [facility] Patient Information 2015 [place], [place]. This information is not intended to replace advice given to you by your health care provider. Make sure you discuss any questions you have with your health care provider.

## 2014-10-26 LAB — GC/CHLAMYDIA PROBE AMP (~~LOC~~) NOT AT ARMC
CHLAMYDIA, DNA PROBE: NEGATIVE
Neisseria Gonorrhea: NEGATIVE

## 2014-10-26 LAB — RPR

## 2014-10-26 LAB — CYTOLOGY - PAP

## 2014-10-26 LAB — HIV ANTIBODY (ROUTINE TESTING W REFLEX): HIV 1&2 Ab, 4th Generation: NONREACTIVE

## 2014-10-29 NOTE — Progress Notes (Signed)
Quick Note:  Called patient to discuss all labs. ______

## 2015-02-11 ENCOUNTER — Emergency Department (HOSPITAL_COMMUNITY)
Admission: EM | Admit: 2015-02-11 | Discharge: 2015-02-11 | Disposition: A | Discharge status: Home / Self Care | Payer: 59 | Attending: Emergency Medicine | Admitting: Emergency Medicine

## 2015-02-11 ENCOUNTER — Emergency Department (HOSPITAL_BASED_OUTPATIENT_CLINIC_OR_DEPARTMENT_OTHER)
Admit: 2015-02-11 | Discharge: 2015-02-11 | Disposition: A | Discharge status: Home / Self Care | Payer: 59 | Attending: Emergency Medicine | Admitting: Emergency Medicine

## 2015-02-11 ENCOUNTER — Encounter (HOSPITAL_COMMUNITY): Payer: Self-pay | Admitting: *Deleted

## 2015-02-11 DIAGNOSIS — M79671 Pain in right foot: Secondary | ICD-10-CM | POA: Diagnosis present

## 2015-02-11 DIAGNOSIS — M7989 Other specified soft tissue disorders: Secondary | ICD-10-CM | POA: Diagnosis not present

## 2015-02-11 DIAGNOSIS — L03115 Cellulitis of right lower limb: Secondary | ICD-10-CM

## 2015-02-11 DIAGNOSIS — Z79899 Other long term (current) drug therapy: Secondary | ICD-10-CM | POA: Diagnosis not present

## 2015-02-11 MED ORDER — SULFAMETHOXAZOLE-TRIMETHOPRIM 800-160 MG PO TABS: 1.0000 | ORAL_TABLET | Freq: Two times a day (BID) | ORAL | Status: AC

## 2015-02-11 MED ORDER — ACETAMINOPHEN 500 MG PO TABS
1000.0000 mg | ORAL_TABLET | Freq: Once | ORAL | Status: AC
  Administered 2015-02-11: 1000 mg via ORAL
  Filled 2015-02-11: qty 2

## 2015-02-11 MED ORDER — CEPHALEXIN 500 MG PO CAPS: 500.0000 mg | ORAL_CAPSULE | Freq: Four times a day (QID) | ORAL | Status: DC

## 2015-02-11 NOTE — Progress Notes (Signed)
*  Preliminary Results* Right lower extremity venous duplex completed. Right lower extremity is negative for deep vein thrombosis. There is no evidence of right Baker's cyst.  02/11/2015 8:53 AM  Gertie Fey, RVT, RDCS, RDMS

## 2015-02-11 NOTE — Discharge Instructions (Signed)

## 2015-02-11 NOTE — ED Notes (Signed)
PT ambulated in hall ,tol well . Pt has a slight limp during ambulation . Post -op shoe ordered for PT.

## 2015-02-11 NOTE — ED Notes (Signed)
Pt presents with swelling to RT foot . Pt reports a known cause and a pain scale 8/10.

## 2015-02-11 NOTE — ED Notes (Deleted)
Declined W/C at D/C and was escorted to lobby by RN. 

## 2015-02-11 NOTE — ED Provider Notes (Signed)
CSN: [id number]     Arrival date & time 02/11/15  0801 History   First MD Initiated Contact with Patient 02/11/15 [phone]     Chief Complaint  Patient presents with  . Foot Pain     (Consider location/radiation/quality/duration/timing/severity/associated sxs/prior Treatment) HPI  This is a 44 year old female who presents emergency Department with chief complaint of right lower leg pain and swelling. Patient states that she had some achiness in her right calf and ankle over the past few days. This morning she awoke and found it markedly swollen, exquisitely tender to palpation, she is swelling and edema into the foot. She noticed heat and redness on the medial aspect of her left calf. She denies any chest pain, shortness of breath. Patient does take oral contraceptive pills containing estrogen. She denies any recent confinement, injury. She denies a history of cellulitis or previous DVTs.  History reviewed. No pertinent past medical history. History reviewed. No pertinent past surgical history. Family History  Problem Relation Age of Onset  . Cancer Father     lung cancer  . Diabetes Brother   . Stroke Brother    Social History  Substance Use Topics  . Smoking status: Never Smoker   . Smokeless tobacco: None  . Alcohol Use: Yes     Comment: occasional   OB History    Gravida Para Term Preterm AB TAB SAB Ectopic Multiple Living   2              Review of Systems  Ten systems reviewed and are negative for acute change, except as noted in the HPI.    Allergies  Review of patient's allergies indicates no known allergies.  Home Medications   Prior to Admission medications   Medication Sig Start Date End Date Taking? Authorizing Provider  norgestimate-ethinyl estradiol (SPRINTEC 28) 0.25-35 MG-MCG tablet Take 1 tablet by mouth daily. 10/25/14   Pincus Large, DO   BP 128/79 mmHg  Pulse 100  Temp(Src) 97.4 F (36.3 C) (Oral)  Resp 20  Ht  (1.651 m)  Wt 145 lb (65.772  kg)  BMI 24.13 kg/m2  SpO2 100%  LMP 01/11/2015 Physical Exam  Constitutional: She is oriented to person, place, and time. She appears well-developed and well-nourished. No distress.  HENT:  Head: Normocephalic and atraumatic.  Eyes: Conjunctivae are normal. No scleral icterus.  Neck: Normal range of motion.  Cardiovascular: Normal rate, regular rhythm and normal heart sounds.  Exam reveals no gallop and no friction rub.   No murmur heard. Pulmonary/Chest: Effort normal and breath sounds normal. No respiratory distress.  Abdominal: Soft. Bowel sounds are normal. She exhibits no distension and no mass. There is no tenderness. There is no guarding.  Musculoskeletal: She exhibits edema and tenderness.  Right foot, ankle and calf markedly swollen in comparison to her left. Pulses are palpable. There is warmth, redness and tenderness with shiny skin over the medial aspect of the left calf. She has pain with flexion, extension of the ankle. Calf is swollen and somewhat tender.  Neurological: She is alert and oriented to person, place, and time.  Skin: Skin is warm and dry. She is not diaphoretic.  Nursing note and vitals reviewed.   ED Course  Procedures (including critical care time) Labs Review Labs Reviewed - No data to display  Imaging Review No results found. Lucila [place], personally reviewed and evaluated these images and lab results as part of my medical decision-making.   EKG Interpretation None  MDM   Final diagnoses:  None    9:16 AM BP 128/79 mmHg  Pulse 100  Temp(Src) 97.4 F (36.3 C) (Oral)  Resp 20  Ht 5\' 5"  (1.651 m)  Wt 145 lb (65.772 kg)  BMI 24.13 kg/m2  SpO2 100%  LMP 01/11/2015 Patient here with swelling and ankle tenderness. Have concern for possible DVT. Patient will receive a duplex ultrasound of the right lower extremity. Patient may also have developing cellulitis. We will treat with Bactrim, Keflex. Given postop shoe. The patient will  follow up for wound recheck. She appears safe for discharge at this time.    Arthor Captain, PA-C 02/11/15 1644  Azalia Bilis, MD 02/12/15 [phone]

## 2015-04-10 ENCOUNTER — Encounter (HOSPITAL_COMMUNITY): Payer: Self-pay | Admitting: *Deleted

## 2015-04-10 ENCOUNTER — Emergency Department (HOSPITAL_COMMUNITY)
Admission: EM | Admit: 2015-04-10 | Discharge: 2015-04-10 | Disposition: A | Discharge status: Home / Self Care | Payer: 59 | Attending: Emergency Medicine | Admitting: Emergency Medicine

## 2015-04-10 DIAGNOSIS — R350 Frequency of micturition: Secondary | ICD-10-CM | POA: Diagnosis not present

## 2015-04-10 DIAGNOSIS — Z3202 Encounter for pregnancy test, result negative: Secondary | ICD-10-CM | POA: Insufficient documentation

## 2015-04-10 DIAGNOSIS — R300 Dysuria: Secondary | ICD-10-CM | POA: Insufficient documentation

## 2015-04-10 DIAGNOSIS — Z792 Long term (current) use of antibiotics: Secondary | ICD-10-CM | POA: Diagnosis not present

## 2015-04-10 DIAGNOSIS — R109 Unspecified abdominal pain: Secondary | ICD-10-CM | POA: Diagnosis present

## 2015-04-10 LAB — URINALYSIS, ROUTINE W REFLEX MICROSCOPIC
Bilirubin Urine: NEGATIVE
Glucose, UA: NEGATIVE mg/dL
Hgb urine dipstick: NEGATIVE
KETONES UR: NEGATIVE mg/dL
LEUKOCYTES UA: NEGATIVE
NITRITE: NEGATIVE
PH: 6 (ref 5.0–8.0)
Protein, ur: NEGATIVE mg/dL
SPECIFIC GRAVITY, URINE: 1.01 (ref 1.005–1.030)
Urobilinogen, UA: 0.2 mg/dL (ref 0.0–1.0)

## 2015-04-10 LAB — HIV ANTIBODY (ROUTINE TESTING W REFLEX): HIV SCREEN 4TH GENERATION: NONREACTIVE

## 2015-04-10 LAB — WET PREP, GENITAL
CLUE CELLS WET PREP: NONE SEEN
TRICH WET PREP: NONE SEEN
Yeast Wet Prep HPF POC: NONE SEEN

## 2015-04-10 LAB — RPR: RPR Ser Ql: NONREACTIVE

## 2015-04-10 LAB — POC URINE PREG, ED: Preg Test, Ur: NEGATIVE

## 2015-04-10 NOTE — ED Notes (Signed)
Pt is here with right lower abdominal pain and frequent urination since Monday.  Pt denies back pain.  Pt reports brown color vaginal discharge- not normal for her. LMP:  In September

## 2015-04-10 NOTE — ED Provider Notes (Addendum)
CSN: [id number]     Arrival date & time 04/10/15  0846 History   First MD Initiated Contact with Patient 04/10/15 [phone]     Chief Complaint  Patient presents with  . Abdominal Pain  . Urinary Frequency     (Consider location/radiation/quality/duration/timing/severity/associated sxs/prior Treatment) Patient is a 44 y.o. female presenting with abdominal pain and frequency.  Abdominal Pain Associated symptoms: no chest pain, no diarrhea, no nausea, no shortness of breath and no vomiting   Urinary Frequency Associated symptoms include abdominal pain. Pertinent negatives include no chest pain, no headaches and no shortness of breath.   patient has had urinary frequency for the last 3 days. No pain. States she's has to go more often. No fevers or chills. She has slight lower abdominal pain on the right. No diarrhea or constipation. States it feels like her tract infection she's had in the past. No chest pain or trouble breathing. No nausea vomiting or diarrhea. She also has had some brown vaginal discharge. No vaginal pain. No other discharge. States she hopes she does not have an STD. Her last menses was in September and was normal.  History reviewed. No pertinent past medical history. History reviewed. No pertinent past surgical history. Family History  Problem Relation Age of Onset  . Cancer Father     lung cancer  . Diabetes Brother   . Stroke Brother    Social History  Substance Use Topics  . Smoking status: Never Smoker   . Smokeless tobacco: None  . Alcohol Use: Yes     Comment: occasional   OB History    Gravida Para Term Preterm AB TAB SAB Ectopic Multiple Living   2              Review of Systems  Constitutional: Negative for activity change and appetite change.  Eyes: Negative for pain.  Respiratory: Negative for chest tightness and shortness of breath.   Cardiovascular: Negative for chest pain and leg swelling.  Gastrointestinal: Positive for abdominal pain. Negative  for nausea, vomiting and diarrhea.  Genitourinary: Positive for frequency. Negative for flank pain.  Musculoskeletal: Negative for back pain and neck stiffness.  Skin: Negative for rash.  Neurological: Negative for weakness, numbness and headaches.  Psychiatric/Behavioral: Negative for behavioral problems.   pelvic exam showed minimal Postell discharge. No cervical motion tenderness. No adnexal tenderness.    Allergies  Review of patient's allergies indicates no known allergies.  Home Medications   Prior to Admission medications   Medication Sig Start Date End Date Taking? Authorizing Provider  cephALEXin (KEFLEX) 500 MG capsule Take 1 capsule (500 mg total) by mouth 4 (four) times daily. Patient not taking: Reported on 04/10/2015 02/11/15   Arthor Captain, PA-C  norgestimate-ethinyl estradiol (SPRINTEC 28) 0.25-35 MG-MCG tablet Take 1 tablet by mouth daily. Patient not taking: Reported on 04/10/2015 10/25/14   Pincus Large, DO   BP 115/75 mmHg  Pulse 77  Temp(Src) 98.2 F (36.8 C) (Oral)  Resp 18  SpO2 100%  LMP  Physical Exam  Constitutional: She appears well-developed and well-nourished.  HENT:  Head: Atraumatic.  Cardiovascular: Normal rate.   Pulmonary/Chest: Effort normal.  Abdominal:  Minimal abdominal tenderness inferiorly. No rebound or guarding.  Genitourinary: No vaginal discharge found.  No cervical motion tenderness.  Neurological: She is alert.  Skin: Skin is warm.    ED Course  Procedures (including critical care time) Labs Review Labs Reviewed  WET PREP, GENITAL - Abnormal; Notable for the following:  WBC, Wet Prep HPF POC FEW (*)    All other components within normal limits  URINALYSIS, ROUTINE W REFLEX MICROSCOPIC (NOT AT [facility])  RPR  HIV ANTIBODY (ROUTINE TESTING)  POC URINE PREG, ED  GC/CHLAMYDIA PROBE AMP ([place]) NOT AT [facility]    Imaging Review No results found. I have personally reviewed and evaluated these images and lab results as  part of my medical decision-making.   EKG Interpretation None      MDM   Final diagnoses:  Dysuria    Patient with dysuria. Wet prep pelvic exam and urine reassuring. Will discharge home.    Benjiman CoreNathan Andrue Dini, MD 04/13/15 1444  Benjiman CoreNathan Caitland Porchia, MD 05/16/15 [phone]

## 2015-04-10 NOTE — Discharge Instructions (Signed)

## 2015-04-11 LAB — GC/CHLAMYDIA PROBE AMP (~~LOC~~) NOT AT ARMC
CHLAMYDIA, DNA PROBE: NEGATIVE
NEISSERIA GONORRHEA: NEGATIVE

## 2016-01-29 ENCOUNTER — Other Ambulatory Visit: Payer: Self-pay | Admitting: Family Medicine

## 2016-01-29 DIAGNOSIS — Z1231 Encounter for screening mammogram for malignant neoplasm of breast: Secondary | ICD-10-CM

## 2016-02-04 ENCOUNTER — Ambulatory Visit: Payer: Self-pay

## 2016-02-19 ENCOUNTER — Ambulatory Visit
Admission: RE | Admit: 2016-02-19 | Discharge: 2016-02-19 | Disposition: A | Discharge status: Home / Self Care | Payer: BLUE CROSS/BLUE SHIELD | Source: Ambulatory Visit | Attending: Family Medicine | Admitting: Family Medicine

## 2016-02-19 DIAGNOSIS — Z1231 Encounter for screening mammogram for malignant neoplasm of breast: Secondary | ICD-10-CM

## 2016-11-11 ENCOUNTER — Ambulatory Visit (INDEPENDENT_AMBULATORY_CARE_PROVIDER_SITE_OTHER): Payer: BLUE CROSS/BLUE SHIELD | Admitting: Obstetrics and Gynecology

## 2016-11-11 ENCOUNTER — Encounter: Payer: Self-pay | Admitting: Obstetrics and Gynecology

## 2016-11-11 VITALS — BP 110/62 | HR 82 | Temp 98.2°F | Ht 65.0 in | Wt 160.0 lb

## 2016-11-11 DIAGNOSIS — Z86001 Personal history of in-situ neoplasm of cervix uteri: Secondary | ICD-10-CM | POA: Insufficient documentation

## 2016-11-11 DIAGNOSIS — B351 Tinea unguium: Secondary | ICD-10-CM | POA: Diagnosis not present

## 2016-11-11 DIAGNOSIS — Z0000 Encounter for general adult medical examination without abnormal findings: Secondary | ICD-10-CM

## 2016-11-11 DIAGNOSIS — R350 Frequency of micturition: Secondary | ICD-10-CM

## 2016-11-11 HISTORY — DX: Personal history of in-situ neoplasm of cervix uteri: Z86.001

## 2016-11-11 LAB — POCT URINALYSIS DIP (MANUAL ENTRY)
BILIRUBIN UA: NEGATIVE mg/dL
Bilirubin, UA: NEGATIVE
Blood, UA: NEGATIVE
Glucose, UA: NEGATIVE mg/dL
Nitrite, UA: NEGATIVE
Protein Ur, POC: NEGATIVE mg/dL
Spec Grav, UA: 1.01 (ref 1.010–1.025)
Urobilinogen, UA: 0.2 E.U./dL
pH, UA: 6 (ref 5.0–8.0)

## 2016-11-11 LAB — POCT CBG (FASTING - GLUCOSE)-MANUAL ENTRY: GLUCOSE FASTING, POC: 79 mg/dL (ref 70–99)

## 2016-11-11 LAB — POCT UA - MICROSCOPIC ONLY

## 2016-11-11 MED ORDER — TERBINAFINE HCL 250 MG PO TABS: 250.0000 mg | ORAL_TABLET | Freq: Every day | ORAL | 0 refills | Status: DC

## 2016-11-11 MED ORDER — NORGESTIMATE-ETH ESTRADIOL 0.25-35 MG-MCG PO TABS: 1.0000 | ORAL_TABLET | Freq: Every day | ORAL | 11 refills | Status: DC

## 2016-11-11 NOTE — Progress Notes (Signed)
46 y.o. year old female presents for well woman/preventative visit.  Acute Concerns:  #Urinary frequency Going to bathroom for last 2 weeks constantly Denies dysuria, abdominal pain Has had a UTI in past No fevers, hematuria, back pain Denies any increased caffeine or alcohol  #Right Middle finger changes Swelling and nail color change Does not get fingernails done No injuries or trauma  No surrounding erythema, drainage, warmth  Diet: eating out a lot with fast food, not well-balanced, no fruits and vegetables   Exercise: does a lot of walking daily ie to store   Sexual History: yes with female partner  Birth history: G2P0  LMP: Patient's last menstrual period was 11/06/2016.  Birth Control: condoms, stopped taking OCPs but wants to try again  Past Medical History:  Diagnosis Date  . History of carcinoma in situ of cervix 11/11/2016  . MIGRAINE HEADACHE 11/22/2009   Qualifier: Diagnosis of  By: Sandi MealyAlm  MD, Judeth CornfieldStephanie    . Neurofibromatosis (HCC) 11/22/2009   Qualifier: Diagnosis of  By: Sandi MealyAlm  MD, Judeth CornfieldStephanie      No past surgical history on file.  No Known Allergies  No current outpatient prescriptions on file prior to visit.   No current facility-administered medications on file prior to visit.    Family History  Problem Relation Age of Onset  . Neurofibromatosis Mother   . Thyroid disease Mother   . Cancer Father        lung cancer  . Diabetes Brother   . Stroke Brother     Social History   Social History  . Marital status: Single    Spouse name: N/A  . Number of children: 2  . Years of education: 5814   Social History Main Topics  . Smoking status: Never Smoker  . Smokeless tobacco: Never Used  . Alcohol use Yes     Comment: occasional  . Drug use: No  . Sexual activity: Yes    Birth control/ protection: Condom   Other Topics Concern  . None   Social History Narrative  . None    There are no preventive care reminders to display for this  patient.  ROS reviewed and were negative unless otherwise noted in HPI.   Physical Exam: BP 110/62   Pulse 82   Temp 98.2 F (36.8 C) (Oral)   Ht 5\' 5"  (1.651 m)   Wt 160 lb (72.6 kg)   LMP 11/06/2016   SpO2 98%   BMI 26.63 kg/m   Constitutional: She is oriented to person, place, and time and well-developed, well-nourished, and in no distress.  HENT:  Head: Normocephalic and atraumatic.  Mouth/Throat: Oropharynx is clear and moist.  Eyes: Conjunctivae and EOM are normal. Pupils are equal, round, and reactive to light.  Neck: Normal range of motion. Neck supple. No thyromegaly present.  Cardiovascular: Normal rate, regular rhythm, normal heart sounds and intact distal pulses.   Pulmonary/Chest: Effort normal and breath sounds normal.  Abdominal: Soft. Bowel sounds are normal. There is no tenderness.  Musculoskeletal: Normal range of motion. She exhibits no edema.  Lymphadenopathy: She has no cervical adenopathy.  Neurological: She is alert and oriented to person, place, and time. No cranial nerve deficit. Gait normal.  Skin: Skin is warm and dry. Multiple neurofibromas.  Psych: Mood and affect are normal; no evidence of anxiety or depression.  Results for orders placed or performed in visit on 11/11/16  POCT urinalysis dipstick  Result Value Ref Range   Color, UA yellow yellow  Clarity, UA cloudy (A) clear   Glucose, UA negative negative mg/dL   Bilirubin, UA negative negative   Ketones, POC UA negative negative mg/dL   Spec Grav, UA 1.610 9.604 - 1.025   Blood, UA negative negative   pH, UA 6.0 5.0 - 8.0   Protein Ur, POC negative negative mg/dL   Urobilinogen, UA 0.2 0.2 or 1.0 E.U./dL   Nitrite, UA Negative Negative   Leukocytes, UA Trace (A) Negative  Glucose (CBG), Fasting  Result Value Ref Range   Glucose Fasting, POC 79 70 - 99 mg/dL  POCT UA - Microscopic Only  Result Value Ref Range   WBC, Ur, HPF, POC 1-5    RBC, urine, microscopic NONE    Bacteria, U  Microscopic FEW    Epithelial cells, urine per micros 1-5     ASSESSMENT & PLAN: 46 y.o. female presents for annual well woman/preventative exam.   1. Healthcare maintenance Up to date on all preventative screenings. Handout provided on preventative health measures. Discussed making healthy lifestyle choices.  2. Urinary frequency Urinary frequency without associated symptoms. UA unremarkable for UTI. Fasting POC glucose normal. Denies excessive alcohol or caffeine use. Patient not on any diuretics. Conservative management and observation at this time. Encouraged patient to increase her water fluid intake. - POCT urinalysis dipstick - Glucose (CBG), Fasting - POCT UA - Microscopic Only  3. Onychomycosis Onychomycosis of the right middle finger. Rx given for terbinafine 250 mg to take daily for 6 weeks. Reevaluate after treatment.  Caryl Ada, DO 11/11/2016, 8:32 AM PGY-3, [facility]

## 2016-11-11 NOTE — Patient Instructions (Addendum)

## 2017-11-16 NOTE — Progress Notes (Signed)
[facility] Phone: [phone]   Date of Visit: 11/17/2017   HPI:  Patient presents today for a well woman exam.   Concerns today:  Lower abdominal discomfort: -Reports of lower abdominal pain that is sharp in character for the past few weeks.  Symptoms are intermittent and can last 5 minutes on average.  She reports of slight dysuria.  Denies any vaginal discharge or odor or itching.  Denies any intermenstrual bleeding.  She is sexually active with one long-term partner.  She uses condoms.  She denies noticing a pattern to her symptoms.  When she lays down she feels better.  She thinks may be the symptoms worsen with certain foods.  No fevers or chills.  No unintentional weight loss. Normal BMs.   Headache: -Patient reports of occasional headaches for the past 2 months.  Aches are mainly located in the occipital region without any radiation.  It is throbbing in nature.  She denies any photophobia or phonophobia with her symptoms.  It resolves with over-the-counter medications.  Denies any weight loss, night sweats fevers.. -Because of her brothers' recent diagnosis of brain tumors, she wonders if she would benefit from imaging of her head. -She reports of having difficulty seeing, but she recently saw her eye doctor and was prescribed a new glasses and contacts prescription.  Since then she has not had any issues with her vision.  Periods: monthly, last 3-4 day, no intermenstrual bleeding.  Contraception: none.  She would like to restart some type of contraception.  After discussion of options, she would like to go back on OCP. STD Screening: She would like to have screening done today. Pap smear status: last pap 09/2014 with normal cytology and negative HPV Exercise: walking daily  Diet: Reports she eats a well-balanced diet.  However she does work at a [employer] and sometimes eats the food there. Smoking: no  Alcohol: socially  Drugs: no  Mood: PHQ  2 negative  ROS:  Review of Systems  Constitutional: Negative for chills, fever, malaise/fatigue and weight loss.  Eyes: Positive for blurred vision. Negative for double vision.  Respiratory: Negative for cough and shortness of breath.   Cardiovascular: Negative for chest pain, palpitations, orthopnea and leg swelling.  Gastrointestinal: Positive for abdominal pain. Negative for blood in stool, constipation, diarrhea, melena, nausea and vomiting.  Genitourinary: Negative for flank pain, frequency and hematuria.  Musculoskeletal: Negative for joint pain and myalgias.  Neurological: Positive for headaches. Negative for dizziness, sensory change, speech change, focal weakness and weakness.  Psychiatric/Behavioral: Negative for depression, substance abuse and suicidal ideas.     PMFSH:  Cancers in family: Brothers: brain tumors.  2 older brothers passed away after finding out that he had brain tumors.  One was June 10, 2017 and one was September 07, 2017.  PMH: Migraine Neurofibromatosis   PHYSICAL EXAM: BP 112/66   Pulse 84   Temp 98.1 F (36.7 C) (Oral)   Ht  (1.651 m)   Wt 156 lb (70.8 kg)   LMP 11/05/2017 (Exact Date)   SpO2 99%   BMI 25.96 kg/m  Gen: NAD, pleasant, cooperative HEENT: PERRL, the left eye appears to deviate laterally with certain extraocular movements (for example when looking up). Other cranial nerves appear intact. no palpable thyromegaly or anterior cervical lymphadenopathy Heart: RRR, no murmurs Lungs: CTAB, NWOB Abdomen: soft, mild tenderness to palpation in the lower abdomen (suprapubic)  without guarding or rebound.  Neuro: grossly nonfocal other than  the left eye as noted above, speech normal Skin: neurofibromas noted on the left forearm, one above her upper lip, one on the right flank.  GU: normal appearing external genitalia without lesions. Vagina is moist without discharge. There was a condom in the right posterior vaginal vault. Cervix  appears irritated with petechiae like areas at the 6 oclock position. There is also red macular spot on the 8 o'clock position. No cervical motion tenderness or tenderness on bimanual exam. No adnexal masses.   ASSESSMENT/PLAN:  # Health maintenance:  -STD screening: completed today  -pap smear: is up to date.  -mammogram: instructions given to schedule  -immunizations: up to date  Abnormal Appearance of Cervix:  Last pap was unremarkable. The irritation may be due to the retained condom that was removed. Due to today's findings, will recommend appointment for colposcopy. Gonorrhea and chlamydia testing done today.   Headache:  The headache characteristics do not seem concerning. 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  Therefore will obtain MRI head for further evaluation.   Abdominal Pain:  Patient is stable and afebrile. UA is negative for infectious process. Not consistent with PID or Appendicitis. Wonder if she had discomfort due to the retained condom in vaginal canal. Will monitor for now but did order basic labs as well as STI screening. Return precautions discussed.   Contraception:  Urine pregnancy negative. Patient desires to restart OCP. No personal for family history of DVT or PE. No history of tobacco use. SPrintec prescribed.    Palma Holter, MD PGY 3 [facility]

## 2017-11-17 ENCOUNTER — Other Ambulatory Visit (HOSPITAL_COMMUNITY)
Admission: RE | Admit: 2017-11-17 | Discharge: 2017-11-17 | Disposition: A | Discharge status: Home / Self Care | Payer: BLUE CROSS/BLUE SHIELD | Source: Ambulatory Visit | Attending: Family Medicine | Admitting: Family Medicine

## 2017-11-17 ENCOUNTER — Encounter: Payer: Self-pay | Admitting: Internal Medicine

## 2017-11-17 ENCOUNTER — Ambulatory Visit (INDEPENDENT_AMBULATORY_CARE_PROVIDER_SITE_OTHER): Payer: BLUE CROSS/BLUE SHIELD | Admitting: Internal Medicine

## 2017-11-17 ENCOUNTER — Other Ambulatory Visit: Payer: Self-pay

## 2017-11-17 VITALS — BP 112/66 | HR 84 | Temp 98.1°F | Ht 65.0 in | Wt 156.0 lb

## 2017-11-17 DIAGNOSIS — R29818 Other symptoms and signs involving the nervous system: Secondary | ICD-10-CM

## 2017-11-17 DIAGNOSIS — Z3009 Encounter for other general counseling and advice on contraception: Secondary | ICD-10-CM

## 2017-11-17 DIAGNOSIS — Z0000 Encounter for general adult medical examination without abnormal findings: Secondary | ICD-10-CM

## 2017-11-17 DIAGNOSIS — Z113 Encounter for screening for infections with a predominantly sexual mode of transmission: Secondary | ICD-10-CM | POA: Insufficient documentation

## 2017-11-17 DIAGNOSIS — N889 Noninflammatory disorder of cervix uteri, unspecified: Secondary | ICD-10-CM | POA: Diagnosis not present

## 2017-11-17 DIAGNOSIS — Z1231 Encounter for screening mammogram for malignant neoplasm of breast: Secondary | ICD-10-CM

## 2017-11-17 DIAGNOSIS — R109 Unspecified abdominal pain: Secondary | ICD-10-CM

## 2017-11-17 DIAGNOSIS — Z3202 Encounter for pregnancy test, result negative: Secondary | ICD-10-CM | POA: Diagnosis not present

## 2017-11-17 DIAGNOSIS — Z1239 Encounter for other screening for malignant neoplasm of breast: Secondary | ICD-10-CM

## 2017-11-17 LAB — POCT URINALYSIS DIP (MANUAL ENTRY)
Bilirubin, UA: NEGATIVE
Blood, UA: NEGATIVE
GLUCOSE UA: NEGATIVE mg/dL
Ketones, POC UA: NEGATIVE mg/dL
Leukocytes, UA: NEGATIVE
Nitrite, UA: NEGATIVE
Protein Ur, POC: NEGATIVE mg/dL
Spec Grav, UA: 1.01 (ref 1.010–1.025)
UROBILINOGEN UA: 0.2 U/dL
pH, UA: 7 (ref 5.0–8.0)

## 2017-11-17 LAB — POCT URINE PREGNANCY: Preg Test, Ur: NEGATIVE

## 2017-11-17 MED ORDER — NORGESTIMATE-ETH ESTRADIOL 0.25-35 MG-MCG PO TABS: 1.0000 | ORAL_TABLET | Freq: Every day | ORAL | 11 refills | Status: AC

## 2017-11-17 NOTE — Patient Instructions (Addendum)
It was a pleasure seeing you today in our clinic.   1)  I sent in Sprintec to your pharmacy  2) I will get in touch with you about your lab results by phone (or letter if everything is normal).  3) Please make a visit in GYN clinic here at [facility] for colposcopy.  4) I will get back to you about the MRI brain  5) please let me know if your abdominal symptoms do not improve 6) make an appointment for pap smear    Our clinic's number is [phone]. Please call with questions or concerns about what we discussed today.   Be well, Dr. Ottie Glazier

## 2017-11-18 ENCOUNTER — Telehealth: Payer: Self-pay | Admitting: Internal Medicine

## 2017-11-18 LAB — CBC
HEMATOCRIT: 40.9 % (ref 34.0–46.6)
Hemoglobin: 13.7 g/dL (ref 11.1–15.9)
MCH: 28.9 pg (ref 26.6–33.0)
MCHC: 33.5 g/dL (ref 31.5–35.7)
MCV: 86 fL (ref 79–97)
Platelets: 168 10*3/uL (ref 150–450)
RBC: 4.74 x10E6/uL (ref 3.77–5.28)
RDW: 14.4 % (ref 12.3–15.4)
WBC: 5.2 10*3/uL (ref 3.4–10.8)

## 2017-11-18 LAB — CERVICOVAGINAL ANCILLARY ONLY
Chlamydia: NEGATIVE
Neisseria Gonorrhea: NEGATIVE
Trichomonas: NEGATIVE

## 2017-11-18 LAB — BASIC METABOLIC PANEL
BUN/Creatinine Ratio: 10 (ref 9–23)
BUN: 6 mg/dL (ref 6–24)
CO2: 21 mmol/L (ref 20–29)
Calcium: 8.9 mg/dL (ref 8.7–10.2)
Chloride: 106 mmol/L (ref 96–106)
Creatinine, Ser: 0.62 mg/dL (ref 0.57–1.00)
GFR, EST AFRICAN AMERICAN: 123 mL/min/{1.73_m2} (ref >59)
GFR, EST NON AFRICAN AMERICAN: 107 mL/min/{1.73_m2} (ref >59)
Glucose: 79 mg/dL (ref 65–99)
Potassium: 4.4 mmol/L (ref 3.5–5.2)
Sodium: 140 mmol/L (ref 134–144)

## 2017-11-18 LAB — HIV ANTIBODY (ROUTINE TESTING W REFLEX): HIV Screen 4th Generation wRfx: NONREACTIVE

## 2017-11-18 LAB — RPR: RPR Ser Ql: NONREACTIVE

## 2017-11-18 NOTE — Telephone Encounter (Signed)
Discussed normal lab work with patient

## 2017-12-02 ENCOUNTER — Other Ambulatory Visit: Payer: Self-pay

## 2017-12-02 ENCOUNTER — Ambulatory Visit (INDEPENDENT_AMBULATORY_CARE_PROVIDER_SITE_OTHER): Payer: BLUE CROSS/BLUE SHIELD | Admitting: Family Medicine

## 2017-12-02 VITALS — BP 105/70 | HR 79 | Temp 97.6°F | Wt 155.0 lb

## 2017-12-02 DIAGNOSIS — N889 Noninflammatory disorder of cervix uteri, unspecified: Secondary | ICD-10-CM | POA: Diagnosis not present

## 2017-12-02 NOTE — Patient Instructions (Signed)
It was great to see you!  Your colposcopy looked completely normal! No need for biopsy. Resume getting PAP smears on your regular schedule.  Take care and seek immediate care sooner if you develop any concerns.   Dr. Mollie Germanyumball [facility]

## 2017-12-03 NOTE — Progress Notes (Signed)
Patient given informed consent, signed copy in the chart.  Placed in lithotomy position. Cervix viewed with speculum and colposcope after application of acetic acid.   Colposcopy adequate (entire squamocolumnar junctions seen  in entirety) ?  Yes Acetowhite lesions?  None Punctation?  None Mosaicism?  None Abnormal vasculature?  None Biopsies?  No ECC?  No Complications?  None  COMMENTS:  Patient was given post procedure instructions.    Abnormal gross cervical exam on Pap smear.  Notably at the time they also found a retained condom in the posterior vaginal vault.  Her colposcopy today is clinically normal so I suspect the lesion they saw that day was related to irritation from the retained condom.  Her Pap smear was normal so we will have her follow-up on regular Pap smear screening schedule.

## 2017-12-29 ENCOUNTER — Other Ambulatory Visit: Payer: Self-pay | Admitting: Internal Medicine

## 2017-12-29 DIAGNOSIS — Z1231 Encounter for screening mammogram for malignant neoplasm of breast: Secondary | ICD-10-CM

## 2018-02-02 ENCOUNTER — Ambulatory Visit
Admission: RE | Admit: 2018-02-02 | Discharge: 2018-02-02 | Disposition: A | Discharge status: Home / Self Care | Payer: BLUE CROSS/BLUE SHIELD | Source: Ambulatory Visit | Attending: Family Medicine | Admitting: Family Medicine

## 2018-02-02 DIAGNOSIS — Z1231 Encounter for screening mammogram for malignant neoplasm of breast: Secondary | ICD-10-CM

## 2019-01-16 ENCOUNTER — Other Ambulatory Visit: Payer: Self-pay | Admitting: Family Medicine

## 2019-01-16 DIAGNOSIS — Z1231 Encounter for screening mammogram for malignant neoplasm of breast: Secondary | ICD-10-CM

## 2019-03-01 ENCOUNTER — Ambulatory Visit
Admission: RE | Admit: 2019-03-01 | Discharge: 2019-03-01 | Disposition: A | Discharge status: Home / Self Care | Payer: BC Managed Care – PPO | Source: Ambulatory Visit | Attending: Internal Medicine | Admitting: Internal Medicine

## 2019-03-01 ENCOUNTER — Other Ambulatory Visit: Payer: Self-pay

## 2019-03-01 DIAGNOSIS — Z1231 Encounter for screening mammogram for malignant neoplasm of breast: Secondary | ICD-10-CM

## 2019-07-02 ENCOUNTER — Other Ambulatory Visit: Payer: Self-pay

## 2019-07-02 ENCOUNTER — Encounter (HOSPITAL_BASED_OUTPATIENT_CLINIC_OR_DEPARTMENT_OTHER): Payer: Self-pay

## 2019-07-02 ENCOUNTER — Emergency Department (HOSPITAL_BASED_OUTPATIENT_CLINIC_OR_DEPARTMENT_OTHER)
Admission: EM | Admit: 2019-07-02 | Discharge: 2019-07-02 | Disposition: A | Discharge status: Home / Self Care | Payer: BC Managed Care – PPO | Attending: Emergency Medicine | Admitting: Emergency Medicine

## 2019-07-02 DIAGNOSIS — Z79899 Other long term (current) drug therapy: Secondary | ICD-10-CM | POA: Insufficient documentation

## 2019-07-02 DIAGNOSIS — L03012 Cellulitis of left finger: Secondary | ICD-10-CM | POA: Insufficient documentation

## 2019-07-02 MED ORDER — LIDOCAINE HCL (PF) 1 % IJ SOLN
5.0000 mL | Freq: Once | INTRAMUSCULAR | Status: AC
  Administered 2019-07-02: 20:00:00 5 mL via INTRADERMAL
  Filled 2019-07-02: qty 5

## 2019-07-02 NOTE — Discharge Instructions (Signed)
Contact a health care provider if: °Your symptoms get worse or do not improve with treatment. °You have continued or increased fluid, blood, or pus coming from the affected area. °Your finger or knuckle becomes swollen or difficult to move. °Get help right away if you have: °A fever or chills. °Redness spreading away from the affected area. °Joint or muscle pain. °

## 2019-07-02 NOTE — ED Triage Notes (Signed)
Paronychia to R index finger x 3 days.

## 2019-07-02 NOTE — ED Notes (Signed)
Pt states she needs to leave. Asked pt to wait to see if provider can checkon her before she leaves.

## 2019-07-02 NOTE — ED Provider Notes (Signed)
[facility] Provider Note   CSN: [id number] Arrival date & time: 07/02/19  1520     History Chief Complaint  Patient presents with  . Paronychia    Julia Dalton is a 49 y.o. female who presents emergency department with chief complaint of right index finger pain.  She has had 3 days of worsening swelling and pain at the end of her right index finger.  She complains of throbbing pain.  She has noticed some pale discoloration around the ulnar nail bed.  She is never had anything like this before.  She denies fevers or chills.  HPI     Past Medical History:  Diagnosis Date  . History of carcinoma in situ of cervix 11/11/2016  . MIGRAINE HEADACHE 11/22/2009   Qualifier: Diagnosis of  By: Carlena Sax  MD, Colletta [place]    . Neurofibromatosis ([place]) 11/22/2009   Qualifier: Diagnosis of  By: Carlena Sax  MD, Colletta [place]      Patient Active Problem List   Diagnosis Date Noted  . Healthcare maintenance 10/17/2014  . Neurofibromatosis ([place]) 11/22/2009  . MIGRAINE HEADACHE 11/22/2009  . SICKLE CELL TRAIT 08/26/2006    History reviewed. No pertinent surgical history.   OB History    Gravida  2   Para      Term      Preterm      AB      Living        SAB      TAB      Ectopic      Multiple      Live Births              Family History  Problem Relation Age of Onset  . Neurofibromatosis Mother   . Thyroid disease Mother   . Cancer Father        lung cancer  . Diabetes Brother   . Stroke Brother   . Brain cancer Brother   . Brain cancer Brother     Social History   Tobacco Use  . Smoking status: Never Smoker  . Smokeless tobacco: Never Used  Substance Use Topics  . Alcohol use: Yes    Comment: occasional  . Drug use: No    Home Medications Prior to Admission medications   Medication Sig Start Date End Date Taking? Authorizing Provider  norgestimate-ethinyl estradiol (SPRINTEC 28) 0.25-35 MG-MCG tablet Take 1 tablet by mouth daily.  11/17/17   Smiley Houseman, MD    Allergies    Patient has no known allergies.  Review of Systems   Review of Systems  Constitutional: Negative for chills and fever.  Musculoskeletal: Negative for joint swelling.  Skin: Positive for wound.  Neurological: Negative for weakness and numbness.    Physical Exam Updated Vital Signs BP (!) 122/101 (BP Location: Left Arm)   Pulse 74   Temp 97.8 F (36.6 C) (Oral)   Resp 20   Ht 5\' 5"  (1.651 m)   Wt 65.8 kg   LMP 03/02/2019   SpO2 100%   BMI 24.13 kg/m   Physical Exam Vitals and nursing note reviewed.  Constitutional:      General: She is not in acute distress.    Appearance: She is well-developed. She is not diaphoretic.  HENT:     Head: Normocephalic and atraumatic.  Eyes:     General: No scleral icterus.    Conjunctiva/sclera: Conjunctivae normal.  Cardiovascular:     Rate and Rhythm: Normal rate and  regular rhythm.     Heart sounds: Normal heart sounds. No murmur. No friction rub. No gallop.   Pulmonary:     Effort: Pulmonary effort is normal. No respiratory distress.     Breath sounds: Normal breath sounds.  Abdominal:     General: Bowel sounds are normal. There is no distension.     Palpations: Abdomen is soft. There is no mass.     Tenderness: There is no abdominal tenderness. There is no guarding.  Musculoskeletal:     Cervical back: Normal range of motion.  Skin:    General: Skin is warm and dry.  Neurological:     Mental Status: She is alert and oriented to person, place, and time.  Psychiatric:        Behavior: Behavior normal.     ED Results / Procedures / Treatments   Labs (all labs ordered are listed, but only abnormal results are displayed) Labs Reviewed - No data to display  EKG None  Radiology No results found.  Procedures Drain paronychia  Date/Time: 07/02/2019 8:42 PM Performed by: Arthor Captain, PA-C Authorized by: Arthor Captain, PA-C  Consent: Verbal consent  obtained. Risks and benefits: risks, benefits and alternatives were discussed Consent given by: patient Required items: required blood products, implants, devices, and special equipment available Patient identity confirmed: verbally with patient Time out: Immediately prior to procedure a "time out" was called to verify the correct patient, procedure, equipment, support staff and site/side marked as required. Preparation: Patient was prepped and draped in the usual sterile fashion. Local anesthesia used: yes  Anesthesia: Local anesthesia used: yes Local Anesthetic: lidocaine 1% without epinephrine Anesthetic total: 2 mL  Sedation: Patient sedated: no  Patient tolerance: patient tolerated the procedure well with no immediate complications    (including critical care time)  Medications Ordered in ED Medications  lidocaine (PF) (XYLOCAINE) 1 % injection 5 mL (5 mLs Intradermal Given 07/02/19 2003)    ED Course  I have reviewed the triage vital signs and the nursing notes.  Pertinent labs & imaging results that were available during my care of the patient were reviewed by me and considered in my medical decision making (see chart for details).    MDM Rules/Calculators/A&P                      With paronychia of the right index finger.  Paronychia was drained with out complication.  Patient feels much improved.  Discharge charged with home care instructions.  Discussed return precautions.  She is not diabetic or has other underlying immunocompromise.  Patient appears appropriate for discharge at this time. Final Clinical Impression(s) / ED Diagnoses Final diagnoses:  Paronychia of left index finger    Rx / DC Orders ED Discharge Orders    None       Arthor Captain, PA-C 07/02/19 2045    Terald Sleeper, MD 07/03/19 1249

## 2019-11-01 ENCOUNTER — Ambulatory Visit
Admission: EM | Admit: 2019-11-01 | Discharge: 2019-11-01 | Disposition: A | Discharge status: Home / Self Care | Payer: BLUE CROSS/BLUE SHIELD | Attending: Physician Assistant | Admitting: Physician Assistant

## 2019-11-01 DIAGNOSIS — N898 Other specified noninflammatory disorders of vagina: Secondary | ICD-10-CM | POA: Insufficient documentation

## 2019-11-01 DIAGNOSIS — R350 Frequency of micturition: Secondary | ICD-10-CM | POA: Diagnosis not present

## 2019-11-01 DIAGNOSIS — R300 Dysuria: Secondary | ICD-10-CM | POA: Insufficient documentation

## 2019-11-01 LAB — POCT URINALYSIS DIP (MANUAL ENTRY)
Bilirubin, UA: NEGATIVE
Blood, UA: NEGATIVE
Glucose, UA: NEGATIVE mg/dL
Ketones, POC UA: NEGATIVE mg/dL
Leukocytes, UA: NEGATIVE
Nitrite, UA: NEGATIVE
Protein Ur, POC: NEGATIVE mg/dL
Spec Grav, UA: 1.015 (ref 1.010–1.025)
Urobilinogen, UA: 0.2 E.U./dL
pH, UA: 5.5 (ref 5.0–8.0)

## 2019-11-01 MED ORDER — FLUCONAZOLE 150 MG PO TABS: 150.0000 mg | ORAL_TABLET | Freq: Every day | ORAL | 0 refills | Status: AC

## 2019-11-01 NOTE — ED Provider Notes (Signed)
[facility]    CSN: [id number] Arrival date & time: 11/01/19  9983      History   Chief Complaint Chief Complaint  Patient presents with  . Back Pain    HPI Julia Dalton is a 49 y.o. female.   50 year old female comes in for 3 day history of urinary symptoms. Urinary frequency, mild dysuria. Also experiencing lower back pain that is radiating across lower abdomen. Denies nausea, vomiting, diarrhea. Denies fever, chills. Slight burning sensation to the vulva with itching. ?rash. Sexually active with 1 female partner. LMP 10/23/2019. Changes in body wash.      Past Medical History:  Diagnosis Date  . History of carcinoma in situ of cervix 11/11/2016  . MIGRAINE HEADACHE 11/22/2009   Qualifier: Diagnosis of  By: Sandi Mealy  MD, Judeth Cornfield    . Neurofibromatosis ([facility]) 11/22/2009   Qualifier: Diagnosis of  By: Sandi Mealy  MD, Judeth Cornfield      Patient Active Problem List   Diagnosis Date Noted  . Healthcare maintenance 10/17/2014  . Neurofibromatosis ([facility]) 11/22/2009  . MIGRAINE HEADACHE 11/22/2009  . SICKLE CELL TRAIT 08/26/2006    History reviewed. No pertinent surgical history.  OB History    Gravida  2   Para      Term      Preterm      AB      Living        SAB      TAB      Ectopic      Multiple      Live Births               Home Medications    Prior to Admission medications   Medication Sig Start Date End Date Taking? Authorizing Provider  fluconazole (DIFLUCAN) 150 MG tablet Take 1 tablet (150 mg total) by mouth daily. Take second dose 72 hours later if symptoms still persists. 11/01/19   Belinda Fisher, PA-C  norgestimate-ethinyl estradiol (SPRINTEC 28) 0.25-35 MG-MCG tablet Take 1 tablet by mouth daily. 11/17/17   Palma Holter, MD    Family History Family History  Problem Relation Age of Onset  . Neurofibromatosis Mother   . Thyroid disease Mother   . Cancer Father        lung cancer  . Diabetes Brother   . Stroke Brother   . Brain  cancer Brother   . Brain cancer Brother     Social History Social History   Tobacco Use  . Smoking status: Never Smoker  . Smokeless tobacco: Never Used  Substance Use Topics  . Alcohol use: Yes    Comment: occasional  . Drug use: No     Allergies   Patient has no known allergies.   Review of Systems Review of Systems  Reason unable to perform ROS: See HPI as above.     Physical Exam Triage Vital Signs ED Triage Vitals [11/01/19 0836]  Enc Vitals Group     BP 124/78     Pulse Rate (!) 102     Resp 18     Temp 98.5 F (36.9 C)     Temp Source Oral     SpO2 97 %     Weight      Height      Head Circumference      Peak Flow      Pain Score      Pain Loc      Pain Edu?  Excl. in [place]?    No data found.  Updated Vital Signs BP 124/78 (BP Location: Left Arm)   Pulse (!) 102   Temp 98.5 F (36.9 C) (Oral)   Resp 18   LMP 10/23/2019   SpO2 97%   Physical Exam Exam conducted with a chaperone present.  Constitutional:      General: She is not in acute distress.    Appearance: She is well-developed. She is not ill-appearing, toxic-appearing or diaphoretic.  HENT:     Head: Normocephalic and atraumatic.  Eyes:     Conjunctiva/sclera: Conjunctivae normal.     Pupils: Pupils are equal, round, and reactive to light.  Cardiovascular:     Rate and Rhythm: Normal rate and regular rhythm.  Pulmonary:     Effort: Pulmonary effort is normal. No respiratory distress.     Comments: LCTAB Abdominal:     General: Bowel sounds are normal.     Palpations: Abdomen is soft.     Tenderness: There is no abdominal tenderness. There is no right CVA tenderness, left CVA tenderness, guarding or rebound.  Genitourinary:    Comments: No rashes, swelling, irritation. No vaginal discharge to the vaginal opening.  Musculoskeletal:     Cervical back: Normal range of motion and neck supple.     Comments: No back pain on palpation  Skin:    General: Skin is warm and dry.   Neurological:     Mental Status: She is alert and oriented to person, place, and time.  Psychiatric:        Behavior: Behavior normal.        Judgment: Judgment normal.      UC Treatments / Results  Labs (all labs ordered are listed, but only abnormal results are displayed) Labs Reviewed  URINE CULTURE  POCT URINALYSIS DIP (MANUAL ENTRY)    EKG   Radiology No results found.  Procedures Procedures (including critical care time)  Medications Ordered in UC Medications - No data to display  Initial Impression / Assessment and Plan / UC Course  I have reviewed the triage vital signs and the nursing notes.  Pertinent labs & imaging results that were available during my care of the patient were reviewed by me and considered in my medical decision making (see chart for details).    Urine negative. Will send for urine culture due to symptoms. Exam reassuring. Will cover for yeast due to history with diflucan. Push fluids. Return precautions given. Patient expresses understanding and agrees to plan.  Final Clinical Impressions(s) / UC Diagnoses   Final diagnoses:  Urinary frequency  Dysuria  Vaginal irritation   ED Prescriptions    Medication Sig Dispense Auth. Provider   fluconazole (DIFLUCAN) 150 MG tablet Take 1 tablet (150 mg total) by mouth daily. Take second dose 72 hours later if symptoms still persists. 2 tablet Ok Edwards, PA-C     PDMP not reviewed this encounter.   Ok Edwards, PA-C 11/01/19 [phone]

## 2019-11-01 NOTE — ED Triage Notes (Signed)
Pt c/o lower back pain radiating across lower abdomen with urinary frequency and slight burning x3 days.

## 2019-11-01 NOTE — Discharge Instructions (Signed)
Your urine was negative for infection or blood. However, due to your history, I will be sending for urine culture. You can take diflucan for possible yeast that is causing itching/irrigation. Discontinue new soap for now. Keep hydrated, urine should be clear to pale yellow in color. Monitor for any worsening of symptoms, fever, worsening abdominal pain, nausea/vomiting, flank pain, follow up for reevaluation.

## 2019-11-03 LAB — URINE CULTURE

## 2020-01-30 IMAGING — MG MM DIGITAL SCREENING BILAT W/ CAD
4 series · 4 of 4 positions shown · non-contrast
Comparison: Previous exam(s).

CLINICAL DATA: Screening.

EXAM:
DIGITAL SCREENING BILATERAL MAMMOGRAM WITH CAD

[R CC]
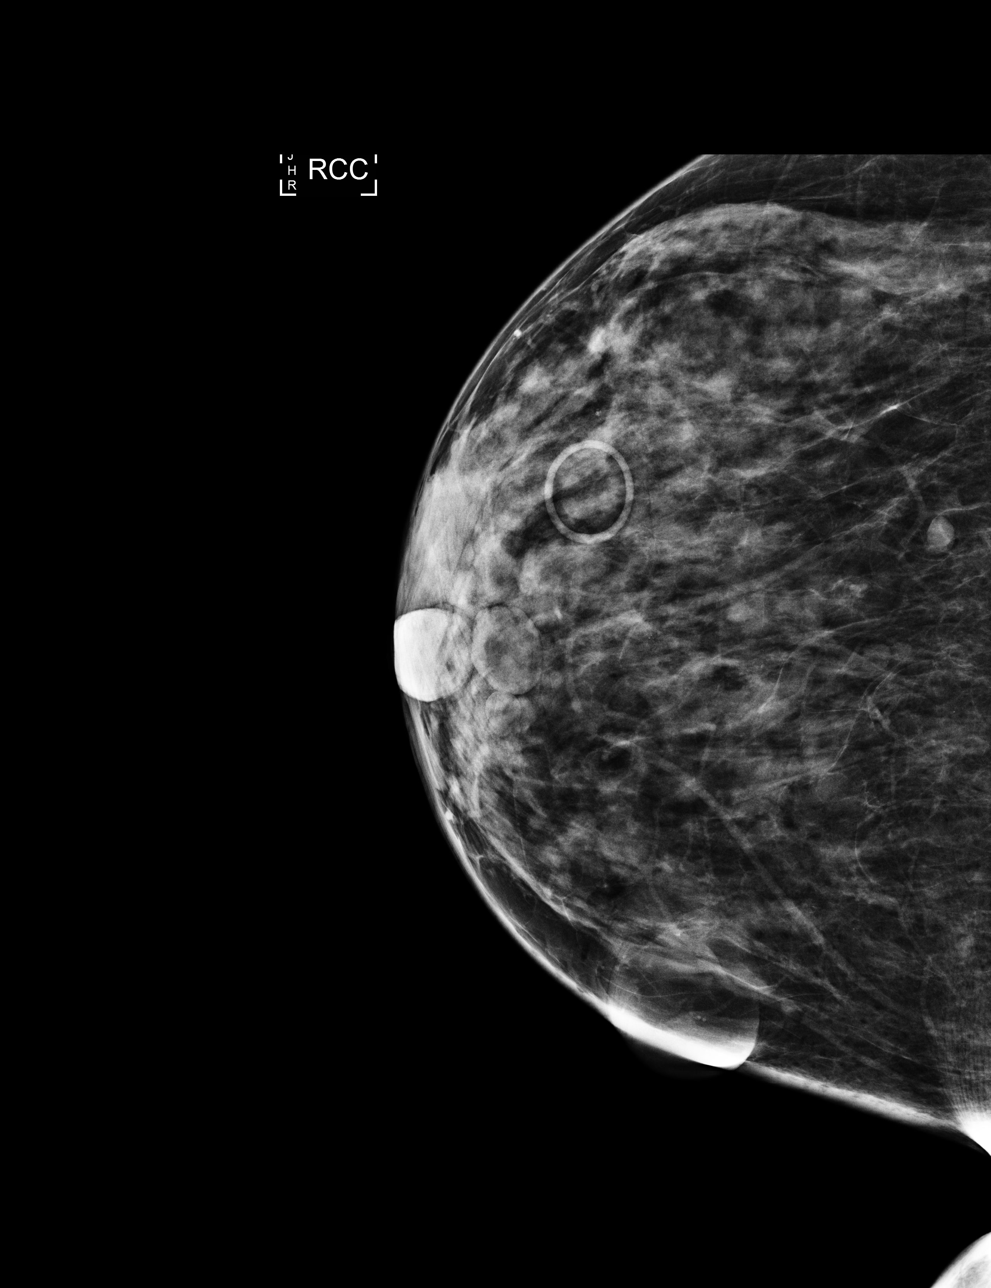

[L CC]
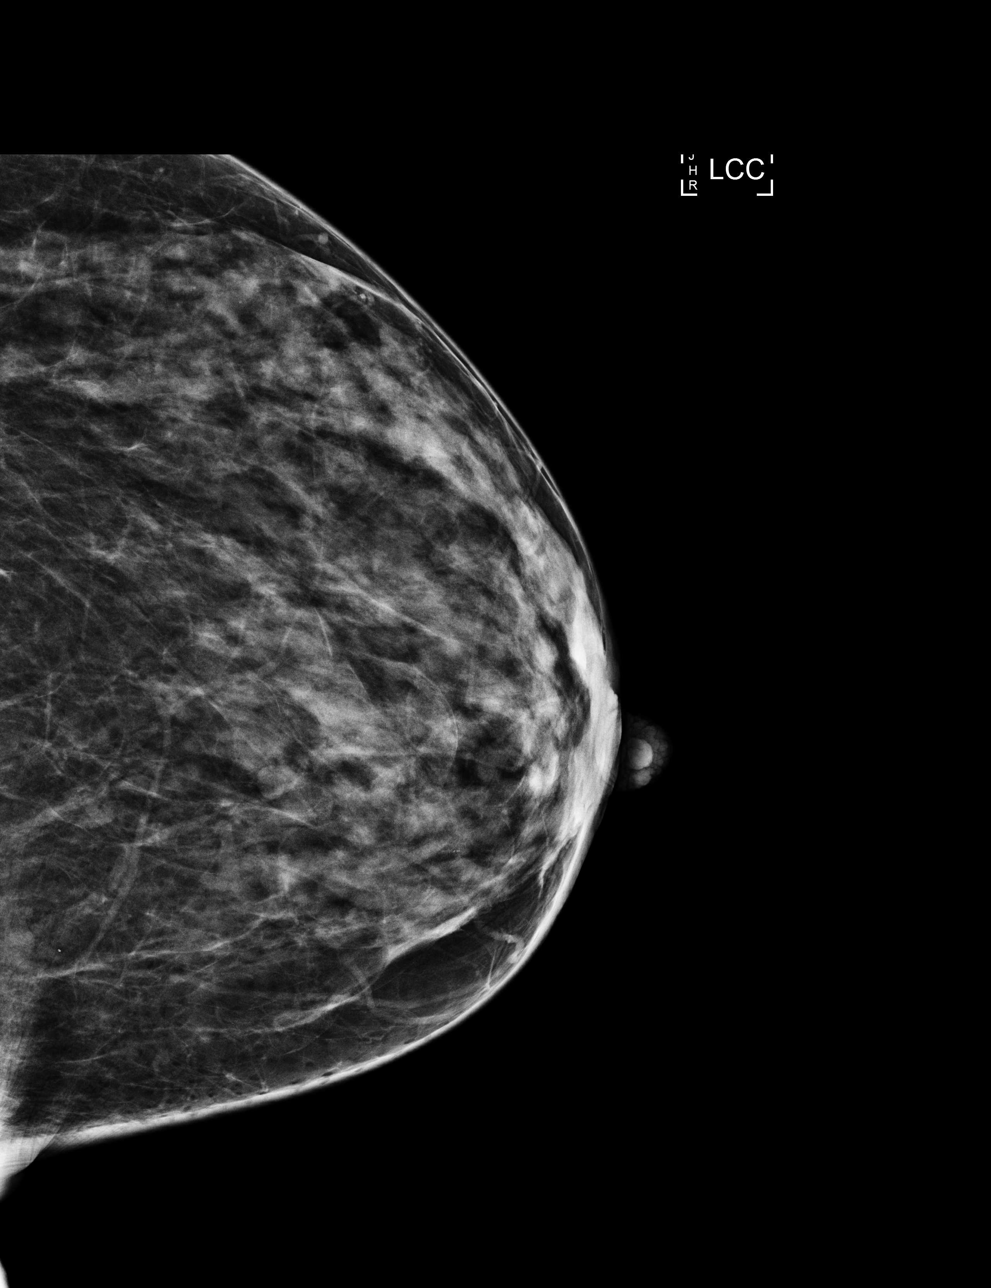

[R MLO]
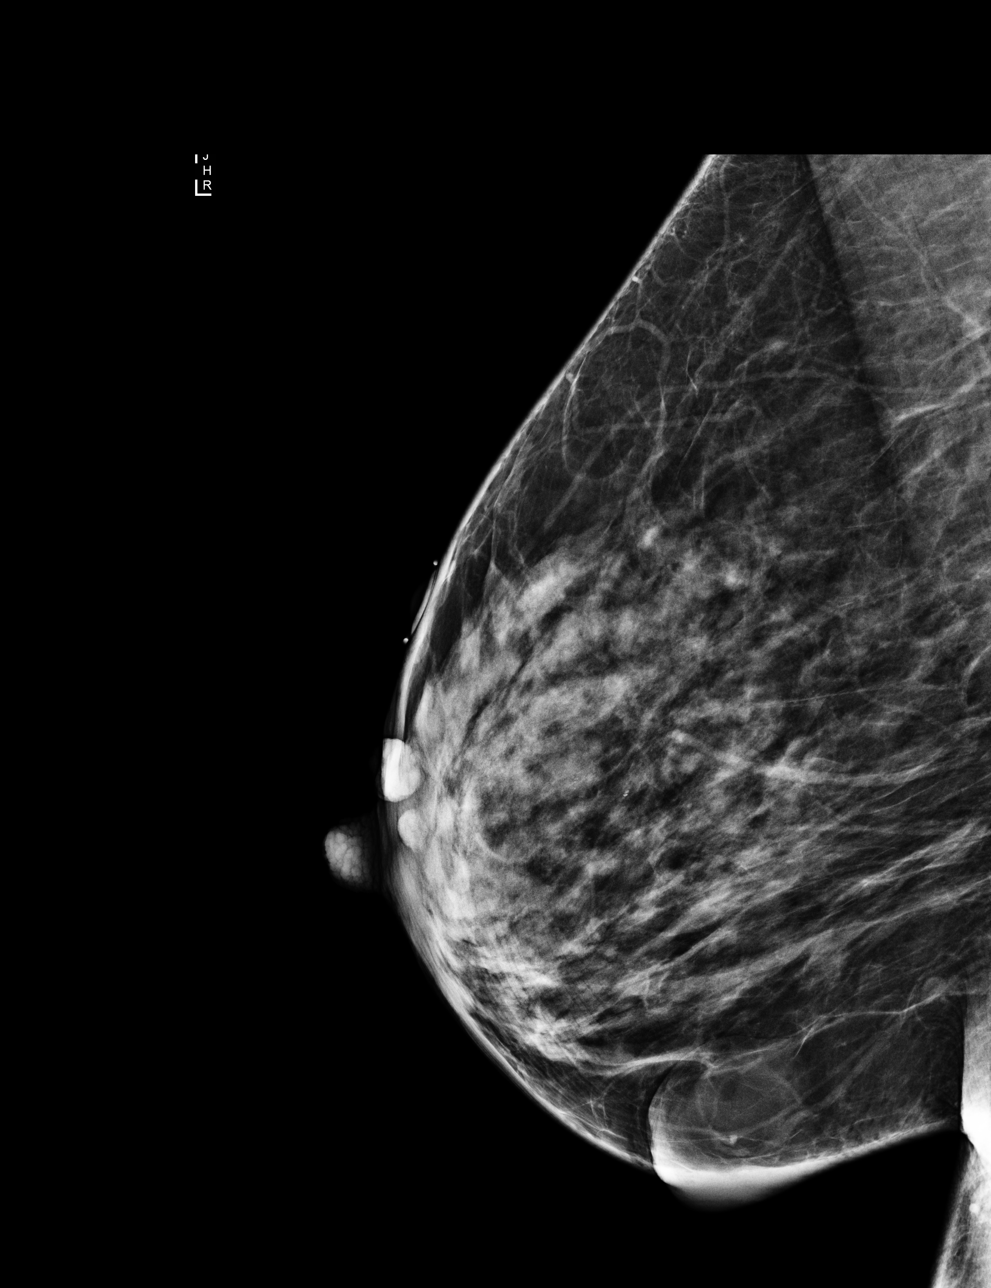

[L MLO]
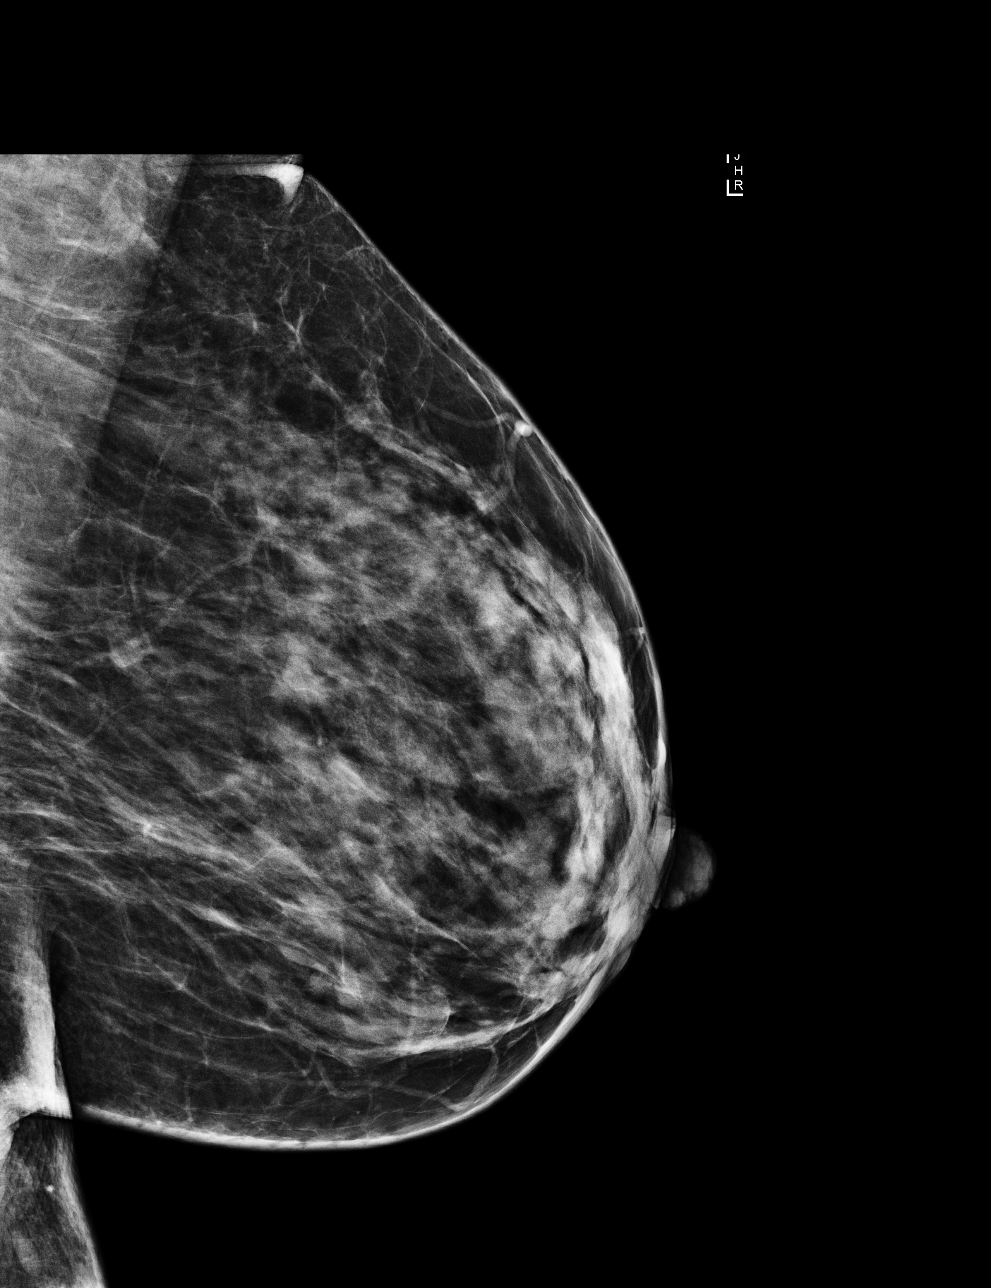

[4 of 4 positions shown; findings below may reference images not displayed]

ACR Breast Density Category c: The breast tissue is heterogeneously
dense, which may obscure small masses.
FINDINGS: There are no findings suspicious for malignancy. Images were
processed with CAD.
IMPRESSION: No mammographic evidence of malignancy. A result letter of this
screening mammogram will be mailed directly to the patient.

RECOMMENDATION:
Screening mammogram in one year. (Code:YJ-[id number])

BI-RADS CATEGORY  1: Negative.
